# Patient Record
Sex: Female | Born: 1965 | Race: Black or African American | Hispanic: No | Marital: Married | Smoking: Former smoker
Health system: Southern US, Community
[De-identification: ages and names within clinical notes are randomized; demographics above are authoritative.]

## PROBLEM LIST (undated history)

## (undated) DIAGNOSIS — Z9049 Acquired absence of other specified parts of digestive tract: Secondary | ICD-10-CM

## (undated) HISTORY — PX: CHOLECYSTECTOMY: SHX55

---

## 2006-05-02 ENCOUNTER — Emergency Department: Payer: Self-pay | Admitting: Emergency Medicine

## 2008-04-07 ENCOUNTER — Emergency Department: Payer: Self-pay | Admitting: Emergency Medicine

## 2010-05-28 ENCOUNTER — Emergency Department: Payer: Self-pay | Admitting: Emergency Medicine

## 2011-06-06 ENCOUNTER — Emergency Department (HOSPITAL_COMMUNITY): Payer: Self-pay

## 2011-06-06 ENCOUNTER — Emergency Department (HOSPITAL_COMMUNITY)
Admission: EM | Admit: 2011-06-06 | Discharge: 2011-06-07 | Disposition: A | Discharge status: Home / Self Care | Payer: No Typology Code available for payment source | Attending: Emergency Medicine | Admitting: Emergency Medicine

## 2011-06-06 DIAGNOSIS — M79609 Pain in unspecified limb: Secondary | ICD-10-CM | POA: Insufficient documentation

## 2011-06-06 DIAGNOSIS — S62143A Displaced fracture of body of hamate [unciform] bone, unspecified wrist, initial encounter for closed fracture: Secondary | ICD-10-CM | POA: Insufficient documentation

## 2011-06-06 DIAGNOSIS — IMO0002 Reserved for concepts with insufficient information to code with codable children: Secondary | ICD-10-CM | POA: Insufficient documentation

## 2011-06-06 DIAGNOSIS — S82899A Other fracture of unspecified lower leg, initial encounter for closed fracture: Secondary | ICD-10-CM | POA: Insufficient documentation

## 2011-06-06 HISTORY — DX: Acquired absence of other specified parts of digestive tract: Z90.49

## 2011-06-07 ENCOUNTER — Encounter (HOSPITAL_COMMUNITY): Payer: Self-pay | Admitting: Radiology

## 2011-06-07 ENCOUNTER — Emergency Department (HOSPITAL_COMMUNITY): Payer: Self-pay

## 2011-06-07 LAB — POCT I-STAT, CHEM 8
Calcium, Ion: 1.13 mmol/L (ref 1.12–1.32)
Creatinine, Ser: 0.6 mg/dL (ref 0.50–1.10)
Glucose, Bld: 122 mg/dL — ABNORMAL HIGH (ref 70–99)
HCT: 44 % (ref 36.0–46.0)
Hemoglobin: 15 g/dL (ref 12.0–15.0)
Potassium: 3.1 mEq/L — ABNORMAL LOW (ref 3.5–5.1)
TCO2: 21 mmol/L (ref 0–100)

## 2011-06-07 MED ORDER — IOHEXOL 300 MG/ML  SOLN
100.0000 mL | Freq: Once | INTRAMUSCULAR | Status: AC | PRN
  Administered 2011-06-07: 100 mL via INTRAVENOUS

## 2011-12-22 ENCOUNTER — Emergency Department: Payer: Self-pay | Admitting: Emergency Medicine
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
## 2021-07-11 ENCOUNTER — Ambulatory Visit
Admission: EM | Admit: 2021-07-11 | Discharge: 2021-07-11 | Disposition: A | Discharge status: Home / Self Care | Payer: Self-pay | Attending: Emergency Medicine | Admitting: Emergency Medicine

## 2021-07-11 ENCOUNTER — Encounter: Payer: Self-pay | Admitting: Emergency Medicine

## 2021-07-11 ENCOUNTER — Other Ambulatory Visit: Payer: Self-pay

## 2021-07-11 DIAGNOSIS — K047 Periapical abscess without sinus: Secondary | ICD-10-CM

## 2021-07-11 MED ORDER — TRAMADOL HCL 50 MG PO TABS: 50.0000 mg | ORAL_TABLET | Freq: Three times a day (TID) | ORAL | 0 refills | Status: AC | PRN

## 2021-07-11 MED ORDER — AMOXICILLIN-POT CLAVULANATE 875-125 MG PO TABS: 1.0000 | ORAL_TABLET | Freq: Two times a day (BID) | ORAL | 0 refills | Status: DC

## 2021-07-11 NOTE — Discharge Instructions (Signed)
Follow up with dentist to make appointment as soon as possible for evaluation of  left infected/fractured tooth  Use antiseptic mouthwash after each meal to keep area clean Use a soft bristle toothbrush as not to irritate tooth further Floss gently between teeth Rinse your mouth with warm salt water every 2 hours to help relieve pain/swelling. Mix 1 teaspoon of salt to 1 cup of water. Take all medications as prescribed. You may apply ice to affected area of left jaw wrapped in a towel 3-5 times daily for 10-15 minute intervals for discomfort/swelling Apply Orajel as needed to affected area to help with pain.  You may also alternate Tylenol/ibuprofen as needed if these medications are not contraindicated to any current health conditions. Eat soft foods that are not very crunchy (avoid nuts, potato chips, hard candy, etc. until appointment with dentist) Eat foods that are not very hot or very cold as this may irritate the tooth Return to clinic if symptoms worsen  You have been prescribed antibiotics for your illness today Take antibiotic with a meal for each dose Eat Greek yogurt daily while on antibiotic or take a daily probiotic supplement Finish all antibiotic as prescribed Increase fluid intake If you experience worsening of symptoms return to clinic If you experience chest pain, shortness of breath or dizziness go to the ER  Try and contact [facility] for quicker evaluation located at: Address: [address] [place], [place], [place] [address] Phone: [phone]

## 2021-07-11 NOTE — ED Provider Notes (Signed)
CHIEF COMPLAINT:   Chief Complaint  Patient presents with   Oral Swelling     SUBJECTIVE/HPI:  HPI A very pleasant 55 y.o.Female presents today with oral swelling.  Patient reports that she has 2 broken teeth on the left side of her mouth to the upper aspect and swelling to the left lower jaw.  Patient reports that her pain and swelling started yesterday.  Patient reports that she tried to get in with her dentist, but was unable to get in for a few weeks. Patient does not report any shortness of breath, chest pain, palpitations, visual changes, weakness, tingling, headache, nausea, vomiting, diarrhea, fever, chills,   has a past medical history of S/P laparoscopic cholecystectomy. ROS:  Review of Systems See Subjective/HPI Medications, Allergies and Problem List personally reviewed in Epic today OBJECTIVE:   Vitals:   07/11/21 1955  BP: (!) 167/77  Pulse: 92  Temp: 99.2 F (37.3 C)  SpO2: 98%    Physical Exam   General: Appears well-developed and well-nourished. No acute distress.  HEENT Head: Normocephalic and atraumatic.  Ears: Hearing grossly intact, no drainage or visible deformity.  Nose: No nasal deviation or rhinorrhea.  Mouth/Throat: No stridor or tracheal deviation.  Poor dentition noted to left upper jaw with missing teeth and multiple broken teeth with adjacent abscess to outer aspect of left upper gumline. Eyes: Conjunctivae and EOM are normal. No eye drainage or scleral icterus bilaterally.  Neck: Normal range of motion, neck is supple.  Cardiovascular: Normal rate Pulm/Chest: No respiratory distress Neurological: Alert and oriented to person, place, and time.  Skin: Skin is warm and dry.  Psychiatric: Normal mood, affect, behavior, and thought content.   Vital signs and nursing note reviewed.   Patient stable and cooperative with examination.  LABS/X-RAYS/EKG/MEDS:   No results found for any visits on 07/11/21.  MEDICAL DECISION MAKING:   Patient  presents with oral swelling.  Patient reports that she has 2 broken teeth on the left side of her mouth to the upper aspect and swelling to the left lower jaw.  Patient reports that her pain and swelling started yesterday.  Patient reports that she tried to get in with her dentist, but was unable to get in for a few weeks. Patient does not report any shortness of breath, chest pain, palpitations, visual changes, weakness, tingling, headache, nausea, vomiting, diarrhea, fever, chills, given symptoms along with assessment findings, likely dental infection.  Rx'd Augmentin to the patient's preferred pharmacy and tramadol to help with discomfort.  Advised about home treatment and care to include antiseptic mouthwash, ice to the area, Orajel, Tylenol versus ibuprofen and soft foods.  Return with any worsening of symptoms.  Advised to report to the emergency department with any chest pain, shortness of breath or dizziness.  Patient verbalized understanding and agreed with plan.  Patient stable upon discharge.  ASSESSMENT/PLAN:  1. Dental infection - amoxicillin-clavulanate (AUGMENTIN) 875-125 MG tablet; Take 1 tablet by mouth every 12 (twelve) hours.  Dispense: 14 tablet; Refill: 0 - traMADol (ULTRAM) 50 MG tablet; Take 1 tablet (50 mg total) by mouth every 8 (eight) hours as needed for up to 5 days.  Dispense: 15 tablet; Refill: 0  Instructions about new medications and side effects provided.  Plan:   Discharge Instructions      Follow up with dentist to make appointment as soon as possible for evaluation of  left infected/fractured tooth  Use antiseptic mouthwash after each meal to keep area clean Use a soft  bristle toothbrush as not to irritate tooth further Floss gently between teeth Rinse your mouth with warm salt water every 2 hours to help relieve pain/swelling. Mix 1 teaspoon of salt to 1 cup of water. Take all medications as prescribed. You may apply ice to affected area of left jaw wrapped  in a towel 3-5 times daily for 10-15 minute intervals for discomfort/swelling Apply Orajel as needed to affected area to help with pain.  You may also alternate Tylenol/ibuprofen as needed if these medications are not contraindicated to any current health conditions. Eat soft foods that are not very crunchy (avoid nuts, potato chips, hard candy, etc. until appointment with dentist) Eat foods that are not very hot or very cold as this may irritate the tooth Return to clinic if symptoms worsen  You have been prescribed antibiotics for your illness today Take antibiotic with a meal for each dose Eat Greek yogurt daily while on antibiotic or take a daily probiotic supplement Finish all antibiotic as prescribed Increase fluid intake If you experience worsening of symptoms return to clinic If you experience chest pain, shortness of breath or dizziness go to the ER  Try and contact [facility] for quicker evaluation located at: Address: [address] [place], [place], [place] [address] Phone: [phone]          Amalia Greenhouse, [place] 07/11/21 2015

## 2021-07-11 NOTE — ED Triage Notes (Signed)
Pt has 2 broken teeth on the left side of her face. She has some facial swelling and pain that started yesterday. She has a dentist appt. scheduled for a few weeks.

## 2023-03-23 NOTE — Progress Notes (Deleted)
New patient visit  Patient: Dawn Jones   DOB: 06/05/1966   57 y.o. Female  MRN: [id number] Visit Date: 03/27/2023  Today's healthcare provider: Debera Lat, PA-C   No chief complaint on file.  Subjective    Dawn Jones is a 57 y.o. female who presents today as a new patient to establish care.  HPI  *** Discussed the use of AI scribe software for clinical note transcription with the patient, who gave verbal consent to proceed.  History of Present Illness            Past Medical History:  Diagnosis Date   S/P laparoscopic cholecystectomy    No past surgical history on file. No family status information on file.   No family history on file. Social History   Socioeconomic History   Marital status: Married    Spouse name: Not on file   Number of children: Not on file   Years of education: Not on file   Highest education level: Not on file  Occupational History   Not on file  Tobacco Use   Smoking status: Former    Types: Cigarettes   Smokeless tobacco: Never  Vaping Use   Vaping status: Former  Substance and Sexual Activity   Alcohol use: Never   Drug use: Never   Sexual activity: Not on file  Other Topics Concern   Not on file  Social History Narrative   Not on file   Social Determinants of Health   Financial Resource Strain: Not on file  Food Insecurity: Not on file  Transportation Needs: Not on file  Physical Activity: Not on file  Stress: Not on file  Social Connections: Not on file   Outpatient Medications Prior to Visit  Medication Sig   amoxicillin-clavulanate (AUGMENTIN) 875-125 MG tablet Take 1 tablet by mouth every 12 (twelve) hours.   No facility-administered medications prior to visit.   No Known Allergies   There is no immunization history on file for this patient.  Health Maintenance  Topic Date Due   HIV Screening  Never done   Hepatitis C Screening  Never done   DTaP/Tdap/Td (1 - Tdap) Never done   PAP  SMEAR-Modifier  Never done   Colonoscopy  Never done   MAMMOGRAM  Never done   Zoster Vaccines- Shingrix (1 of 2) Never done   COVID-19 Vaccine (1 - 2023-24 season) Never done   INFLUENZA VACCINE  04/10/2023   HPV VACCINES  Aged Out    Patient Care Team: Pcp, No as PCP - General  Review of Systems  All other systems reviewed and are negative.  Except see HPI   {Insert previous labs (optional):23779}  {See past labs  Heme  Chem  Endocrine  Serology  Results Review (optional):1}   Objective    There were no vitals taken for this visit. {Insert last BP/Wt (optional):23777}  {See vitals history (optional):1}  Physical Exam Vitals reviewed.  Constitutional:      General: She is not in acute distress.    Appearance: Normal appearance. She is well-developed. She is not diaphoretic.  HENT:     Head: Normocephalic and atraumatic.  Eyes:     General: No scleral icterus.    Conjunctiva/sclera: Conjunctivae normal.  Neck:     Thyroid: No thyromegaly.  Cardiovascular:     Rate and Rhythm: Normal rate and regular rhythm.     Pulses: Normal pulses.     Heart sounds: Normal heart sounds. No murmur heard.  Pulmonary:     Effort: Pulmonary effort is normal. No respiratory distress.     Breath sounds: Normal breath sounds. No wheezing, rhonchi or rales.  Musculoskeletal:     Cervical back: Neck supple.     Right lower leg: No edema.     Left lower leg: No edema.  Lymphadenopathy:     Cervical: No cervical adenopathy.  Skin:    General: Skin is warm and dry.     Findings: No rash.  Neurological:     Mental Status: She is alert and oriented to person, place, and time. Mental status is at baseline.  Psychiatric:        Mood and Affect: Mood normal.        Behavior: Behavior normal.     Depression Screen     No data to display         No results found for any visits on 03/27/23.  Assessment & Plan     *** Assessment and Plan              Encounter to  establish care Welcomed to our clinic Reviewed past medical hx, social hx, family hx and surgical hx Pt advised to send all vaccination records or screening   No follow-ups on file.     [facility]

## 2023-03-27 ENCOUNTER — Ambulatory Visit: Payer: Medicaid Other | Admitting: Physician Assistant

## 2023-03-27 DIAGNOSIS — Z7689 Persons encountering health services in other specified circumstances: Secondary | ICD-10-CM

## 2023-04-10 DIAGNOSIS — Z419 Encounter for procedure for purposes other than remedying health state, unspecified: Secondary | ICD-10-CM | POA: Diagnosis not present

## 2023-05-11 DIAGNOSIS — Z419 Encounter for procedure for purposes other than remedying health state, unspecified: Secondary | ICD-10-CM | POA: Diagnosis not present

## 2023-05-20 ENCOUNTER — Encounter: Payer: Self-pay | Admitting: Physician Assistant

## 2023-05-20 ENCOUNTER — Ambulatory Visit (INDEPENDENT_AMBULATORY_CARE_PROVIDER_SITE_OTHER): Payer: Medicaid Other | Admitting: Physician Assistant

## 2023-05-20 VITALS — BP 135/93 | HR 75 | Ht 62.0 in | Wt 140.9 lb

## 2023-05-20 DIAGNOSIS — Z23 Encounter for immunization: Secondary | ICD-10-CM

## 2023-05-20 DIAGNOSIS — Z136 Encounter for screening for cardiovascular disorders: Secondary | ICD-10-CM

## 2023-05-20 DIAGNOSIS — R5383 Other fatigue: Secondary | ICD-10-CM | POA: Diagnosis not present

## 2023-05-20 DIAGNOSIS — Z1231 Encounter for screening mammogram for malignant neoplasm of breast: Secondary | ICD-10-CM

## 2023-05-20 DIAGNOSIS — R030 Elevated blood-pressure reading, without diagnosis of hypertension: Secondary | ICD-10-CM | POA: Insufficient documentation

## 2023-05-20 DIAGNOSIS — Z1159 Encounter for screening for other viral diseases: Secondary | ICD-10-CM

## 2023-05-20 DIAGNOSIS — Z114 Encounter for screening for human immunodeficiency virus [HIV]: Secondary | ICD-10-CM

## 2023-05-20 DIAGNOSIS — Z2821 Immunization not carried out because of patient refusal: Secondary | ICD-10-CM

## 2023-05-20 DIAGNOSIS — Z1211 Encounter for screening for malignant neoplasm of colon: Secondary | ICD-10-CM

## 2023-05-20 DIAGNOSIS — Z7689 Persons encountering health services in other specified circumstances: Secondary | ICD-10-CM

## 2023-05-20 NOTE — Progress Notes (Signed)
New patient visit  Patient: Dawn Jones   DOB: [birth year]   57 y.o. Female  MRN: [id number] Visit Date: 05/20/2023  Today's healthcare provider: Debera Lat, PA-C   Chief Complaint  Patient presents with   New Patient (Initial Visit)    Pap Smear - 3 years ago   Subjective    Dawn Jones is a 57 y.o. female who presents today as a new patient to establish care.   Discussed the use of AI scribe software for clinical note transcription with the patient, who gave verbal consent to proceed.  History of Present Illness   The patient, a physically active woman with a history of gallbladder surgery, presents with elevated blood pressure. She attributes this to a pinched nerve in her neck, which has been causing her discomfort for the past two days. She regularly monitors her blood pressure, which is typically within normal limits. She also reports occasional fatigue, but no other symptoms of concern. Her mother has a history of hypertension and high cholesterol, but the patient believes these conditions are related to lifestyle factors rather than heredity. She does not smoke or use recreational drugs, and she drinks alcohol only occasionally. She is taking care of her husband, who is disabled.      Past Medical History:  Diagnosis Date   S/P laparoscopic cholecystectomy    History reviewed. No pertinent surgical history. Family Status  Relation Name Status   Mother  (Not Specified)   Brother  (Not Specified)   MGF  (Not Specified)  No partnership data on file   Family History  Problem Relation Age of Onset   Heart attack Mother 64   Heart attack Brother 54   Heart attack Maternal Grandfather    Social History   Socioeconomic History   Marital status: Married    Spouse name: Not on file   Number of children: Not on file   Years of education: Not on file   Highest education level: Not on file  Occupational History   Not on file  Tobacco Use   Smoking status:  Former    Types: Cigarettes   Smokeless tobacco: Never   Tobacco comments:    Quit 2021  Vaping Use   Vaping status: Some Days  Substance and Sexual Activity   Alcohol use: Never   Drug use: Never   Sexual activity: Not on file  Other Topics Concern   Not on file  Social History Narrative   Not on file   Social Determinants of Health   Financial Resource Strain: Not on file  Food Insecurity: Not on file  Transportation Needs: Not on file  Physical Activity: Not on file  Stress: Not on file  Social Connections: Not on file   Outpatient Medications Prior to Visit  Medication Sig   amoxicillin-clavulanate (AUGMENTIN) 875-125 MG tablet Take 1 tablet by mouth every 12 (twelve) hours. (Patient not taking: Reported on 05/20/2023)   No facility-administered medications prior to visit.   No Known Allergies  Immunization History  Administered Date(s) Administered   Tdap 01/05/2017   Zoster Recombinant(Shingrix) 05/20/2023    Health Maintenance  Topic Date Due   HIV Screening  Never done   Hepatitis C Screening  Never done   PAP SMEAR-Modifier  Never done   Colonoscopy  Never done   MAMMOGRAM  Never done   COVID-19 Vaccine (1 - 2023-24 season) Never done   INFLUENZA VACCINE  12/08/2023 (Originally 04/10/2023)   Zoster Vaccines- Shingrix (2  of 2) 07/15/2023   DTaP/Tdap/Td (2 - Td or Tdap) 01/06/2027   HPV VACCINES  Aged Out    Patient Care Team: Debera Lat, PA-C as PCP - General (Physician Assistant)  Review of Systems  All other systems reviewed and are negative.  Except see HPI       Objective    BP (!) 135/93 (BP Location: Right Arm, Patient Position: Sitting, Cuff Size: Normal)   Pulse 75   Ht 5\' 2"  (1.575 m)   Wt 140 lb 14.4 oz (63.9 kg)   SpO2 100%   BMI 25.77 kg/m     Physical Exam Vitals reviewed.  Constitutional:      General: She is not in acute distress.    Appearance: Normal appearance. She is well-developed. She is not diaphoretic.   HENT:     Head: Normocephalic and atraumatic.  Eyes:     General: No scleral icterus.    Conjunctiva/sclera: Conjunctivae normal.  Neck:     Thyroid: No thyromegaly.  Cardiovascular:     Rate and Rhythm: Normal rate and regular rhythm.     Pulses: Normal pulses.     Heart sounds: Normal heart sounds. No murmur heard. Pulmonary:     Effort: Pulmonary effort is normal. No respiratory distress.     Breath sounds: Normal breath sounds. No wheezing, rhonchi or rales.  Musculoskeletal:     Cervical back: Neck supple.     Right lower leg: No edema.     Left lower leg: No edema.  Lymphadenopathy:     Cervical: No cervical adenopathy.  Skin:    General: Skin is warm and dry.     Findings: No rash.  Neurological:     Mental Status: She is alert and oriented to person, place, and time. Mental status is at baseline.  Psychiatric:        Mood and Affect: Mood normal.        Behavior: Behavior normal.     Depression Screen    05/20/2023    3:33 PM  PHQ 2/9 Scores  PHQ - 2 Score 0   No results found for any visits on 05/20/23.  Assessment & Plan         Elevated Blood Pressure Noted during today's visit, possibly related to pain from a pinched nerve. Patient reports normal readings at home. -Continue home blood pressure monitoring for the next two weeks. -Return in two weeks with blood pressure readings and device for verification.  Need for shingles vaccine - Administer Zoster, Recombinant (Shingrix) Vaccine  Influenza vaccination declined by patient  Breast cancer screening by mammogram - MM 3D Screening Breast Bilateral - [facility]; Future  Encounter for special screening examination for cardiovascular disorder - Cholesterol, total  Colon cancer screening Low risk screening - Ambulatory referral to gastroenterology for colonoscopy  Need for hepatitis C screening test - Hepatitis C antibody  Encounter for screening for HIV Low risk screening - HIV  Antibody (routine testing w rflx)  Other fatigue Chronic Initial workup - CBC with Differential/Platelet - Comprehensive metabolic panel - Hemoglobin A1c - TSH Will reassess after  receiving lab results  General Health Maintenance -Plan for physical exam and Pap smear in two weeks. -Order Hepatitis C and HIV screening. -Order mammogram. -Order basic blood work due to occasional fatigue. -Advise patient to get a colonoscopy for colon cancer screening.  Stress Patient reports high stress levels due to caring for a disabled husband. Will reassess pt at her next  visit for depression and anxiety     Encounter to establish care Welcomed to our clinic Reviewed past medical hx, social hx, family hx and surgical hx Pt advised to send all vaccination records or screening   Return in about 2 weeks (around 06/03/2023) for CPE, BP f/u.   The patient was advised to call back or seek an in-person evaluation if the symptoms worsen or if the condition fails to improve as anticipated.  I discussed the assessment and treatment plan with the patient. The patient was provided an opportunity to ask questions and all were answered. The patient agreed with the plan and demonstrated an understanding of the instructions.  I, Debera Lat, PA-C have reviewed all documentation for this visit. The documentation on  05/20/23  for the exam, diagnosis, procedures, and orders are all accurate and complete.  Debera Lat, [employer] [facility], MMS [facility] [phone] (phone) [phone] (fax)   [facility]

## 2023-05-21 DIAGNOSIS — Z114 Encounter for screening for human immunodeficiency virus [HIV]: Secondary | ICD-10-CM | POA: Diagnosis not present

## 2023-05-21 DIAGNOSIS — Z136 Encounter for screening for cardiovascular disorders: Secondary | ICD-10-CM | POA: Diagnosis not present

## 2023-05-21 DIAGNOSIS — Z1159 Encounter for screening for other viral diseases: Secondary | ICD-10-CM | POA: Diagnosis not present

## 2023-05-22 LAB — CBC WITH DIFFERENTIAL/PLATELET
Basophils Absolute: 0 10*3/uL (ref 0.0–0.2)
Basos: 0 %
EOS (ABSOLUTE): 0.1 10*3/uL (ref 0.0–0.4)
Eos: 1 %
Hematocrit: 34.9 % (ref 34.0–46.6)
Hemoglobin: 11.1 g/dL (ref 11.1–15.9)
Immature Grans (Abs): 0 10*3/uL (ref 0.0–0.1)
Immature Granulocytes: 0 %
Lymphocytes Absolute: 1.5 10*3/uL (ref 0.7–3.1)
Lymphs: 13 %
MCH: 25.3 pg — ABNORMAL LOW (ref 26.6–33.0)
MCHC: 31.8 g/dL (ref 31.5–35.7)
MCV: 80 fL (ref 79–97)
Monocytes Absolute: 0.7 10*3/uL (ref 0.1–0.9)
Monocytes: 7 %
Neutrophils Absolute: 8.8 10*3/uL — ABNORMAL HIGH (ref 1.4–7.0)
Neutrophils: 79 %
Platelets: 269 10*3/uL (ref 150–450)
RBC: 4.38 x10E6/uL (ref 3.77–5.28)
RDW: 14.6 % (ref 11.7–15.4)
WBC: 11.2 10*3/uL — ABNORMAL HIGH (ref 3.4–10.8)

## 2023-05-22 LAB — COMPREHENSIVE METABOLIC PANEL
ALT: 15 IU/L (ref 0–32)
AST: 18 IU/L (ref 0–40)
Albumin: 3.9 g/dL (ref 3.8–4.9)
Alkaline Phosphatase: 97 IU/L (ref 44–121)
BUN/Creatinine Ratio: 22 (ref 9–23)
BUN: 14 mg/dL (ref 6–24)
Bilirubin Total: 0.4 mg/dL (ref 0.0–1.2)
CO2: 24 mmol/L (ref 20–29)
Calcium: 9 mg/dL (ref 8.7–10.2)
Chloride: 102 mmol/L (ref 96–106)
Creatinine, Ser: 0.64 mg/dL (ref 0.57–1.00)
Globulin, Total: 2.9 g/dL (ref 1.5–4.5)
Glucose: 79 mg/dL (ref 70–99)
Potassium: 4.3 mmol/L (ref 3.5–5.2)
Sodium: 139 mmol/L (ref 134–144)
Total Protein: 6.8 g/dL (ref 6.0–8.5)
eGFR: 105 mL/min/{1.73_m2} (ref >59)

## 2023-05-22 LAB — TSH: TSH: 0.89 u[IU]/mL (ref 0.450–4.500)

## 2023-05-22 LAB — HIV ANTIBODY (ROUTINE TESTING W REFLEX): HIV Screen 4th Generation wRfx: NONREACTIVE

## 2023-05-22 LAB — HEPATITIS C ANTIBODY: Hep C Virus Ab: NONREACTIVE

## 2023-05-22 LAB — HEMOGLOBIN A1C
Est. average glucose Bld gHb Est-mCnc: 120 mg/dL
Hgb A1c MFr Bld: 5.8 % — ABNORMAL HIGH (ref 4.8–5.6)

## 2023-05-22 LAB — CHOLESTEROL, TOTAL: Cholesterol, Total: 181 mg/dL (ref 100–199)

## 2023-05-27 ENCOUNTER — Other Ambulatory Visit: Payer: Self-pay

## 2023-05-27 ENCOUNTER — Telehealth: Payer: Self-pay

## 2023-05-27 DIAGNOSIS — Z1211 Encounter for screening for malignant neoplasm of colon: Secondary | ICD-10-CM

## 2023-05-27 MED ORDER — NA SULFATE-K SULFATE-MG SULF 17.5-3.13-1.6 GM/177ML PO SOLN
1.0000 | Freq: Once | ORAL | 0 refills | Status: AC
Start: 2023-05-27 — End: 2023-05-27

## 2023-05-27 NOTE — Telephone Encounter (Signed)
Pt requesting call back to schedule colonoscopy.

## 2023-05-27 NOTE — Telephone Encounter (Signed)
Gastroenterology Pre-Procedure Review  Request Date: 07/14/23 Requesting Physician: Dr. Allegra Lai  PATIENT REVIEW QUESTIONS: The patient responded to the following health history questions as indicated:    1. Are you having any GI issues? no 2. Do you have a personal history of Polyps? no 3. Do you have a family history of Colon Cancer or Polyps? no 4. Diabetes Mellitus? no 5. Joint replacements in the past 12 months?no 6. Major health problems in the past 3 months?no 7. Any artificial heart valves, MVP, or defibrillator?no    MEDICATIONS & ALLERGIES:    Patient reports the following regarding taking any anticoagulation/antiplatelet therapy:   Plavix, Coumadin, Eliquis, Xarelto, Lovenox, Pradaxa, Brilinta, or Effient? no Aspirin? no  Patient confirms/reports the following medications:  Current Outpatient Medications  Medication Sig Dispense Refill   amoxicillin-clavulanate (AUGMENTIN) 875-125 MG tablet Take 1 tablet by mouth every 12 (twelve) hours. (Patient not taking: Reported on 05/20/2023) 14 tablet 0   No current facility-administered medications for this visit.    Patient confirms/reports the following allergies:  No Known Allergies  No orders of the defined types were placed in this encounter.   AUTHORIZATION INFORMATION Primary Insurance: 1D#: Group #:  Secondary Insurance: 1D#: Group #:  SCHEDULE INFORMATION: Date: 07/14/23 Time: Location: [facility]

## 2023-05-31 DIAGNOSIS — M79601 Pain in right arm: Secondary | ICD-10-CM | POA: Diagnosis not present

## 2023-05-31 DIAGNOSIS — F17210 Nicotine dependence, cigarettes, uncomplicated: Secondary | ICD-10-CM | POA: Diagnosis not present

## 2023-05-31 DIAGNOSIS — R202 Paresthesia of skin: Secondary | ICD-10-CM | POA: Diagnosis not present

## 2023-05-31 DIAGNOSIS — M542 Cervicalgia: Secondary | ICD-10-CM | POA: Diagnosis not present

## 2023-06-01 DIAGNOSIS — M542 Cervicalgia: Secondary | ICD-10-CM | POA: Diagnosis not present

## 2023-06-10 DIAGNOSIS — Z419 Encounter for procedure for purposes other than remedying health state, unspecified: Secondary | ICD-10-CM | POA: Diagnosis not present

## 2023-06-15 NOTE — Progress Notes (Unsigned)
Complete physical exam  Patient: Dawn Jones   DOB: [birth year]   57 y.o. Female  MRN: [id number] Visit Date: 06/16/2023  Today's healthcare provider: Debera Lat, PA-C   No chief complaint on file.  Subjective    Dawn Jones is a 57 y.o. female who presents today for a complete physical exam.  She reports consuming a {diet types:17450} diet. {Exercise:19826} She generally feels {well/fairly well/poorly:18703}. She reports sleeping {well/fairly well/poorly:18703}. She {does/does not:200015} have additional problems to discuss today.  HPI  *** Discussed the use of AI scribe software for clinical note transcription with the patient, who gave verbal consent to proceed.  History of Present Illness            Last depression screening scores    05/20/2023    3:33 PM  PHQ 2/9 Scores  PHQ - 2 Score 0   Last fall risk screening    05/20/2023    3:33 PM  Fall Risk   Falls in the past year? 0  Injury with Fall? 0  Risk for fall due to : No Fall Risks  Follow up Falls evaluation completed   Last Audit-C alcohol use screening    06/09/2023    4:20 PM  Alcohol Use Disorder Test (AUDIT)  1. How often do you have a drink containing alcohol? 1  2. How many drinks containing alcohol do you have on a typical day when you are drinking? 0  3. How often do you have six or more drinks on one occasion? 0  AUDIT-C Score 1   A score of 3 or more in women, and 4 or more in men indicates increased risk for alcohol abuse, EXCEPT if all of the points are from question 1   Past Medical History:  Diagnosis Date   S/P laparoscopic cholecystectomy    No past surgical history on file. Social History   Socioeconomic History   Marital status: Married    Spouse name: Not on file   Number of children: Not on file   Years of education: Not on file   Highest education level: Some college, no degree  Occupational History   Not on file  Tobacco Use   Smoking status: Former     Types: Cigarettes   Smokeless tobacco: Never   Tobacco comments:    Quit 2021  Vaping Use   Vaping status: Some Days  Substance and Sexual Activity   Alcohol use: Never   Drug use: Never   Sexual activity: Not on file  Other Topics Concern   Not on file  Social History Narrative   Not on file   Social Determinants of Health   Financial Resource Strain: Low Risk  (06/09/2023)   Overall Financial Resource Strain (CARDIA)    Difficulty of Paying Living Expenses: Not hard at all  Food Insecurity: No Food Insecurity (06/09/2023)   Hunger Vital Sign    Worried About Running Out of Food in the Last Year: Never true    Ran Out of Food in the Last Year: Never true  Transportation Needs: No Transportation Needs (06/09/2023)   PRAPARE - Administrator, Civil Service (Medical): No    Lack of Transportation (Non-Medical): No  Physical Activity: Sufficiently Active (06/09/2023)   Exercise Vital Sign    Days of Exercise per Week: 7 days    Minutes of Exercise per Session: 60 min  Stress: Stress Concern Present (06/09/2023)   [employer] of Occupational Health -  Occupational Stress Questionnaire    Feeling of Stress : To some extent  Social Connections: Unknown (06/09/2023)   Social Connection and Isolation Panel [NHANES]    Frequency of Communication with Friends and Family: More than three times a week    Frequency of Social Gatherings with Friends and Family: Three times a week    Attends Religious Services: Patient declined    Active Member of Clubs or Organizations: No    Attends Engineer, structural: Not on file    Marital Status: Patient declined  Intimate Partner Violence: Not on file   Family Status  Relation Name Status   Mother  (Not Specified)   Brother  (Not Specified)   MGF  (Not Specified)  No partnership data on file   Family History  Problem Relation Age of Onset   Heart attack Mother 80   Heart attack Brother 11   Heart attack Maternal  Grandfather    No Known Allergies  Patient Care Team: Debera Lat, PA-C as PCP - General (Physician Assistant)   Medications: Outpatient Medications Prior to Visit  Medication Sig   amoxicillin-clavulanate (AUGMENTIN) 875-125 MG tablet Take 1 tablet by mouth every 12 (twelve) hours. (Patient not taking: Reported on 05/20/2023)   No facility-administered medications prior to visit.    Review of Systems  All other systems reviewed and are negative.  Except see HPI  {Insert previous labs (optional):23779} {See past labs  Heme  Chem  Endocrine  Serology  Results Review (optional):1}  Objective    There were no vitals taken for this visit. {Insert last BP/Wt (optional):23777}{See vitals history (optional):1}    Physical Exam Vitals reviewed.  Constitutional:      General: She is not in acute distress.    Appearance: Normal appearance. She is well-developed. She is not ill-appearing, toxic-appearing or diaphoretic.  HENT:     Head: Normocephalic and atraumatic.     Right Ear: Tympanic membrane, ear canal and external ear normal.     Left Ear: Tympanic membrane, ear canal and external ear normal.     Nose: Nose normal. No congestion or rhinorrhea.     Mouth/Throat:     Mouth: Mucous membranes are moist.     Pharynx: Oropharynx is clear. No oropharyngeal exudate.  Eyes:     General: No scleral icterus.       Right eye: No discharge.        Left eye: No discharge.     Conjunctiva/sclera: Conjunctivae normal.     Pupils: Pupils are equal, round, and reactive to light.  Neck:     Thyroid: No thyromegaly.     Vascular: No carotid bruit.  Cardiovascular:     Rate and Rhythm: Normal rate and regular rhythm.     Pulses: Normal pulses.     Heart sounds: Normal heart sounds. No murmur heard.    No friction rub. No gallop.  Pulmonary:     Effort: Pulmonary effort is normal. No respiratory distress.     Breath sounds: Normal breath sounds. No wheezing or rales.   Abdominal:     General: Abdomen is flat. Bowel sounds are normal. There is no distension.     Palpations: Abdomen is soft. There is no mass.     Tenderness: There is no abdominal tenderness. There is no right CVA tenderness, left CVA tenderness, guarding or rebound.     Hernia: No hernia is present.  Musculoskeletal:        General: No swelling,  tenderness, deformity or signs of injury. Normal range of motion.     Cervical back: Normal range of motion and neck supple. No rigidity or tenderness.     Right lower leg: No edema.     Left lower leg: No edema.  Lymphadenopathy:     Cervical: No cervical adenopathy.  Skin:    General: Skin is warm and dry.     Coloration: Skin is not jaundiced or pale.     Findings: No bruising, erythema, lesion or rash.  Neurological:     Mental Status: She is alert and oriented to person, place, and time. Mental status is at baseline.     Gait: Gait normal.  Psychiatric:        Mood and Affect: Mood normal.        Behavior: Behavior normal.        Thought Content: Thought content normal.        Judgment: Judgment normal.      No results found for any visits on 06/16/23.  Assessment & Plan    Routine Health Maintenance and Physical Exam  Exercise Activities and Dietary recommendations  Goals   None     Immunization History  Administered Date(s) Administered   Tdap 01/05/2017   Zoster Recombinant(Shingrix) 05/20/2023    Health Maintenance  Topic Date Due   Pap with HPV screening  Never done   Colon Cancer Screening  Never done   Mammogram  Never done   COVID-19 Vaccine (1 - 2023-24 season) Never done   Flu Shot  12/08/2023*   Zoster (Shingles) Vaccine (2 of 2) 07/15/2023   DTaP/Tdap/Td vaccine (2 - Td or Tdap) 01/06/2027   Hepatitis C Screening  Completed   HIV Screening  Completed   HPV Vaccine  Aged Out  *Topic was postponed. The date shown is not the original due date.    Discussed health benefits of physical activity, and  encouraged her to engage in regular exercise appropriate for her age and condition.  Assessment and Plan              ***  No follow-ups on file.     [facility]

## 2023-06-16 ENCOUNTER — Encounter: Payer: Self-pay | Admitting: Physician Assistant

## 2023-06-16 ENCOUNTER — Ambulatory Visit (INDEPENDENT_AMBULATORY_CARE_PROVIDER_SITE_OTHER): Payer: Medicaid Other | Admitting: Physician Assistant

## 2023-06-16 ENCOUNTER — Ambulatory Visit
Admission: RE | Admit: 2023-06-16 | Discharge: 2023-06-16 | Disposition: A | Discharge status: Home / Self Care | Payer: Medicaid Other | Source: Ambulatory Visit | Attending: Physician Assistant | Admitting: Physician Assistant

## 2023-06-16 VITALS — BP 115/78 | HR 85 | Ht 62.0 in | Wt 138.8 lb

## 2023-06-16 DIAGNOSIS — Z0001 Encounter for general adult medical examination with abnormal findings: Secondary | ICD-10-CM | POA: Diagnosis not present

## 2023-06-16 DIAGNOSIS — M542 Cervicalgia: Secondary | ICD-10-CM

## 2023-06-16 DIAGNOSIS — Z1231 Encounter for screening mammogram for malignant neoplasm of breast: Secondary | ICD-10-CM | POA: Diagnosis not present

## 2023-06-16 DIAGNOSIS — Z0000 Encounter for general adult medical examination without abnormal findings: Secondary | ICD-10-CM

## 2023-06-16 DIAGNOSIS — Z01419 Encounter for gynecological examination (general) (routine) without abnormal findings: Secondary | ICD-10-CM

## 2023-07-09 ENCOUNTER — Telehealth: Payer: Self-pay

## 2023-07-09 DIAGNOSIS — Z1211 Encounter for screening for malignant neoplasm of colon: Secondary | ICD-10-CM

## 2023-07-09 NOTE — Telephone Encounter (Signed)
Call returned. Colonoscopy r/s from 11/04 to 12/02.  Raynelle Fanning in Endo notified.  Referral updated.  Marcelino Duster, CMA

## 2023-07-09 NOTE — Telephone Encounter (Signed)
Pt requestin\g call back to cancel colonoscopy for 07/14/2023 and reschedule

## 2023-07-11 DIAGNOSIS — Z419 Encounter for procedure for purposes other than remedying health state, unspecified: Secondary | ICD-10-CM | POA: Diagnosis not present

## 2023-07-11 NOTE — Telephone Encounter (Signed)
okay

## 2023-07-21 ENCOUNTER — Ambulatory Visit: Payer: Medicaid Other | Admitting: Obstetrics & Gynecology

## 2023-07-30 ENCOUNTER — Telehealth: Payer: Self-pay

## 2023-07-30 NOTE — Telephone Encounter (Signed)
Patient called in because she needed her instructions to be mail back out. I did guide her through mychart to find her letters.

## 2023-08-04 ENCOUNTER — Encounter: Payer: Self-pay | Admitting: Gastroenterology

## 2023-08-04 ENCOUNTER — Encounter: Payer: Self-pay | Admitting: Obstetrics & Gynecology

## 2023-08-04 ENCOUNTER — Other Ambulatory Visit (HOSPITAL_COMMUNITY)
Admission: RE | Admit: 2023-08-04 | Discharge: 2023-08-04 | Disposition: A | Discharge status: Home / Self Care | Payer: Medicaid Other | Source: Ambulatory Visit | Attending: Obstetrics & Gynecology | Admitting: Obstetrics & Gynecology

## 2023-08-04 ENCOUNTER — Ambulatory Visit (INDEPENDENT_AMBULATORY_CARE_PROVIDER_SITE_OTHER): Payer: Medicaid Other | Admitting: Obstetrics & Gynecology

## 2023-08-04 VITALS — BP 135/83 | HR 83 | Ht 62.0 in | Wt 136.4 lb

## 2023-08-04 DIAGNOSIS — Z0000 Encounter for general adult medical examination without abnormal findings: Secondary | ICD-10-CM | POA: Insufficient documentation

## 2023-08-04 DIAGNOSIS — Z01419 Encounter for gynecological examination (general) (routine) without abnormal findings: Secondary | ICD-10-CM

## 2023-08-04 DIAGNOSIS — Z1151 Encounter for screening for human papillomavirus (HPV): Secondary | ICD-10-CM | POA: Diagnosis not present

## 2023-08-04 DIAGNOSIS — R399 Unspecified symptoms and signs involving the genitourinary system: Secondary | ICD-10-CM

## 2023-08-04 DIAGNOSIS — Z124 Encounter for screening for malignant neoplasm of cervix: Secondary | ICD-10-CM | POA: Insufficient documentation

## 2023-08-04 NOTE — Progress Notes (Signed)
GYNECOLOGY ANNUAL PHYSICAL EXAM PROGRESS NOTE  Subjective:    IANNA Jones is a 57 y.o. married P2 who presents for an annual exam as a new patient.  The patient is rarely sexually active due to decreased libido, denies dyspareunia.  The patient participates in regular exercise: yes. Has the patient ever been transfused or tattooed?: yes. The patient reports that there is not domestic violence in her life.   The patient has the following complaints today: She is having some dysuria, using Azo.  Menstrual History:  No LMP recorded. Patient is postmenopausal. LMP around age 70  She denies FH of breast, gyn, colon, pancreas, brain cancer  She is scheduled for colonoscopy next week.     Gynecologic History:    Last Pap: about 5 years ago. Results were: normal.  abnormal pap smears. Last mammogram: 2024. Results were: normal       OB History  No obstetric history on file.    Past Medical History:  Diagnosis Date   S/P laparoscopic cholecystectomy     Past Surgical History:  Procedure Laterality Date   CHOLECYSTECTOMY      Family History  Problem Relation Age of Onset   Heart attack Mother 56   Heart attack Brother 70   Heart attack Maternal Grandfather     Social History   Socioeconomic History   Marital status: Married    Spouse name: Not on file   Number of children: Not on file   Years of education: Not on file   Highest education level: Some college, no degree  Occupational History   Not on file  Tobacco Use   Smoking status: Former    Types: Cigarettes   Smokeless tobacco: Never   Tobacco comments:    Quit 2021  Vaping Use   Vaping status: Some Days  Substance and Sexual Activity   Alcohol use: Never   Drug use: Never   Sexual activity: Yes    Birth control/protection: Post-menopausal  Other Topics Concern   Not on file  Social History Narrative   Not on file   Social Determinants of Health   Financial Resource Strain: Low  Risk  (06/09/2023)   Overall Financial Resource Strain (CARDIA)    Difficulty of Paying Living Expenses: Not hard at all  Food Insecurity: No Food Insecurity (06/09/2023)   Hunger Vital Sign    Worried About Running Out of Food in the Last Year: Never true    Ran Out of Food in the Last Year: Never true  Transportation Needs: No Transportation Needs (06/09/2023)   PRAPARE - Administrator, Civil Service (Medical): No    Lack of Transportation (Non-Medical): No  Physical Activity: Sufficiently Active (06/09/2023)   Exercise Vital Sign    Days of Exercise per Week: 7 days    Minutes of Exercise per Session: 60 min  Stress: Stress Concern Present (06/09/2023)   [employer] of Occupational Health - Occupational Stress Questionnaire    Feeling of Stress : To some extent  Social Connections: Unknown (06/09/2023)   Social Connection and Isolation Panel [NHANES]    Frequency of Communication with Friends and Family: More than three times a week    Frequency of Social Gatherings with Friends and Family: Three times a week    Attends Religious Services: Patient declined    Active Member of Clubs or Organizations: No    Attends Banker Meetings: Not on file    Marital Status: Patient  declined  Intimate Partner Violence: Not on file    No current outpatient medications on file prior to visit.   No current facility-administered medications on file prior to visit.    No Known Allergies   Review of Systems Constitutional: negative for chills, fatigue, fevers and sweats Eyes: negative for irritation, redness and visual disturbance Ears, nose, mouth, throat, and face: negative for hearing loss, nasal congestion, snoring and tinnitus Respiratory: negative for asthma, cough, sputum Cardiovascular: negative for chest pain, dyspnea, exertional chest pressure/discomfort, irregular heart beat, palpitations and syncope Gastrointestinal: negative for abdominal pain, change  in bowel habits, nausea and vomiting Genitourinary: negative for abnormal menstrual periods, genital lesions, sexual problems and vaginal discharge, dysuria and urinary incontinence Integument/breast: negative for breast lump, breast tenderness and nipple discharge Hematologic/lymphatic: negative for bleeding and easy bruising Musculoskeletal:negative for back pain and muscle weakness Neurological: negative for dizziness, headaches, vertigo and weakness Endocrine: negative for diabetic symptoms including polydipsia, polyuria and skin dryness Allergic/Immunologic: negative for hay fever and urticaria      Objective:  Blood pressure 135/83, pulse 83, height 5\' 2"  (1.575 m), weight 136 lb 6.4 oz (61.9 kg). Body mass index is 24.95 kg/m.    General Appearance:    Alert, cooperative, no distress, appears stated age  Head:    Normocephalic, without obvious abnormality, atraumatic  Eyes:    PERRL, conjunctiva/corneas clear, EOM's intact, both eyes  Ears:    Normal external ear canals, both ears  Nose:   Nares normal, septum midline, mucosa normal, no drainage or sinus tenderness  Throat:   Lips, mucosa, and tongue normal; teeth and gums normal  Neck:   Supple, symmetrical, trachea midline, no adenopathy; thyroid: no enlargement/tenderness/nodules; no carotid bruit or JVD  Back:     Symmetric, no curvature, ROM normal, no CVA tenderness  Lungs:     Clear to auscultation bilaterally, respirations unlabored  Chest Wall:    No tenderness or deformity   Heart:    Regular rate and rhythm, S1 and S2 normal, no murmur, rub or gallop  Breast Exam:    No tenderness, masses, or nipple abnormality  Abdomen:     Soft, non-tender, bowel sounds active all four quadrants, no masses, no organomegaly.    Genitalia:    Pelvic:external genitalia normal with mild VVA vagina without lesions, discharge, or tenderness, rectovaginal septum  normal. Cervix normal in appearance, grade 1 prolapse, no cervical motion  tenderness, no adnexal masses or tenderness.  Uterus normal size, shape, anteverted mobile, regular contours, nontender.  Rectal:    Normal external sphincter.  No hemorrhoids appreciated. Internal exam not done.   Extremities:   Extremities normal, atraumatic, no cyanosis or edema  Pulses:   2+ and symmetric all extremities  Skin:   Skin color, texture, turgor normal, no rashes or lesions  Lymph nodes:   Cervical, supraclavicular, and axillary nodes normal  Neurologic:   CNII-XII intact, normal strength, sensation and reflexes throughout   .  Labs:  Lab Results  Component Value Date   WBC 11.2 (H) 05/21/2023   HGB 11.1 05/21/2023   HCT 34.9 05/21/2023   MCV 80 05/21/2023   PLT 269 05/21/2023    Lab Results  Component Value Date   CREATININE 0.64 05/21/2023   BUN 14 05/21/2023   NA 139 05/21/2023   K 4.3 05/21/2023   CL 102 05/21/2023   CO2 24 05/21/2023    Lab Results  Component Value Date   ALT 15 05/21/2023   AST  18 05/21/2023   ALKPHOS 97 05/21/2023   BILITOT 0.4 05/21/2023    Lab Results  Component Value Date   TSH 0.890 05/21/2023     Assessment:   Well woman exam   Plan:   Breast self exam technique reviewed and patient encouraged to perform self-exam monthly. Discussed healthy lifestyle modifications. Pap smear ordered. Flu vaccine: declined Follow up in 1 year for annual exam   Allie Bossier, MD [facility]

## 2023-08-06 LAB — CYTOLOGY - PAP
Comment: NEGATIVE
Diagnosis: NEGATIVE
High risk HPV: NEGATIVE

## 2023-08-06 LAB — URINE CULTURE: Organism ID, Bacteria: NO GROWTH

## 2023-08-10 DIAGNOSIS — Z419 Encounter for procedure for purposes other than remedying health state, unspecified: Secondary | ICD-10-CM | POA: Diagnosis not present

## 2023-08-10 DIAGNOSIS — D509 Iron deficiency anemia, unspecified: Secondary | ICD-10-CM

## 2023-08-10 DIAGNOSIS — K922 Gastrointestinal hemorrhage, unspecified: Secondary | ICD-10-CM

## 2023-08-10 HISTORY — DX: Iron deficiency anemia, unspecified: D50.9

## 2023-08-10 HISTORY — DX: Gastrointestinal hemorrhage, unspecified: K92.2

## 2023-08-11 ENCOUNTER — Telehealth: Payer: Self-pay

## 2023-08-11 ENCOUNTER — Encounter: Payer: Self-pay | Admitting: Gastroenterology

## 2023-08-11 ENCOUNTER — Ambulatory Visit: Payer: Medicaid Other | Admitting: Certified Registered Nurse Anesthetist

## 2023-08-11 ENCOUNTER — Encounter
Admission: RE | Disposition: A | Discharge status: Home / Self Care | Payer: Self-pay | Source: Home / Self Care | Attending: Gastroenterology

## 2023-08-11 ENCOUNTER — Other Ambulatory Visit
Admission: RE | Admit: 2023-08-11 | Discharge: 2023-08-11 | Disposition: A | Discharge status: Home / Self Care | Payer: Medicaid Other | Source: Skilled Nursing Facility | Attending: Gastroenterology | Admitting: Gastroenterology

## 2023-08-11 ENCOUNTER — Ambulatory Visit
Admission: RE | Admit: 2023-08-11 | Discharge: 2023-08-11 | Disposition: A | Discharge status: Home / Self Care | Payer: Medicaid Other | Attending: Gastroenterology | Admitting: Gastroenterology

## 2023-08-11 ENCOUNTER — Other Ambulatory Visit: Payer: Self-pay

## 2023-08-11 DIAGNOSIS — D649 Anemia, unspecified: Secondary | ICD-10-CM

## 2023-08-11 DIAGNOSIS — K5520 Angiodysplasia of colon without hemorrhage: Secondary | ICD-10-CM

## 2023-08-11 DIAGNOSIS — Z87891 Personal history of nicotine dependence: Secondary | ICD-10-CM | POA: Insufficient documentation

## 2023-08-11 DIAGNOSIS — Z9049 Acquired absence of other specified parts of digestive tract: Secondary | ICD-10-CM | POA: Insufficient documentation

## 2023-08-11 DIAGNOSIS — D500 Iron deficiency anemia secondary to blood loss (chronic): Secondary | ICD-10-CM

## 2023-08-11 DIAGNOSIS — Z1211 Encounter for screening for malignant neoplasm of colon: Secondary | ICD-10-CM | POA: Insufficient documentation

## 2023-08-11 HISTORY — PX: COLONOSCOPY WITH PROPOFOL: SHX5780

## 2023-08-11 HISTORY — PX: HEMOSTASIS CONTROL: SHX6838

## 2023-08-11 HISTORY — DX: Angiodysplasia of colon without hemorrhage: K55.20

## 2023-08-11 LAB — CBC
HCT: 32.7 % — ABNORMAL LOW (ref 36.0–46.0)
Hemoglobin: 10.1 g/dL — ABNORMAL LOW (ref 12.0–15.0)
MCH: 24.2 pg — ABNORMAL LOW (ref 26.0–34.0)
MCHC: 30.9 g/dL (ref 30.0–36.0)
MCV: 78.4 fL — ABNORMAL LOW (ref 80.0–100.0)
Platelets: 310 10*3/uL (ref 150–400)
RBC: 4.17 MIL/uL (ref 3.87–5.11)
RDW: 14.2 % (ref 11.5–15.5)
WBC: 6.7 10*3/uL (ref 4.0–10.5)
nRBC: 0 % (ref 0.0–0.2)

## 2023-08-11 LAB — IRON AND TIBC
Iron: 37 ug/dL (ref 28–170)
Saturation Ratios: 7 % — ABNORMAL LOW (ref 10.4–31.8)
TIBC: 528 ug/dL — ABNORMAL HIGH (ref 250–450)
UIBC: 491 ug/dL

## 2023-08-11 LAB — FOLATE: Folate: 20.2 ng/mL (ref >5.9)

## 2023-08-11 LAB — FERRITIN: Ferritin: 4 ng/mL — ABNORMAL LOW (ref 11–307)

## 2023-08-11 LAB — VITAMIN B12: Vitamin B-12: 766 pg/mL (ref 180–914)

## 2023-08-11 SURGERY — COLONOSCOPY WITH PROPOFOL
Anesthesia: General

## 2023-08-11 MED ORDER — FENTANYL CITRATE (PF) 100 MCG/2ML IJ SOLN
INTRAMUSCULAR | Status: DC | PRN
  Administered 2023-08-11: 50 ug via INTRAVENOUS

## 2023-08-11 MED ORDER — LIDOCAINE HCL (CARDIAC) PF 100 MG/5ML IV SOSY
PREFILLED_SYRINGE | INTRAVENOUS | Status: DC | PRN
  Administered 2023-08-11: 50 mg via INTRAVENOUS

## 2023-08-11 MED ORDER — PROPOFOL 10 MG/ML IV BOLUS
INTRAVENOUS | Status: DC | PRN
  Administered 2023-08-11: 40 mg via INTRAVENOUS
  Administered 2023-08-11: 80 mg via INTRAVENOUS
  Administered 2023-08-11: 150 ug/kg/min via INTRAVENOUS

## 2023-08-11 MED ORDER — SODIUM CHLORIDE 0.9 % IV SOLN: INTRAVENOUS | Status: DC

## 2023-08-11 MED ORDER — DEXMEDETOMIDINE HCL IN NACL 80 MCG/20ML IV SOLN
INTRAVENOUS | Status: DC | PRN
Start: 2023-08-11 — End: 2023-08-11
  Administered 2023-08-11: 8 ug via INTRAVENOUS

## 2023-08-11 MED ORDER — PROPOFOL 1000 MG/100ML IV EMUL
INTRAVENOUS | Status: AC
  Filled 2023-08-11: qty 100

## 2023-08-11 MED ORDER — FUSION PLUS PO CAPS: 1.0000 | ORAL_CAPSULE | ORAL | 2 refills | Status: DC

## 2023-08-11 MED ORDER — FENTANYL CITRATE (PF) 100 MCG/2ML IJ SOLN
INTRAMUSCULAR | Status: AC
  Filled 2023-08-11: qty 2

## 2023-08-11 NOTE — Op Note (Addendum)
[facility] Gastroenterology Patient Name: Madlene Hulslander Procedure Date: 08/11/2023 8:24 AM MRN: [id number] Account #: [id number] Date of Birth: 02-15-1966 Admit Type: Outpatient Age: 57 Room: [place] [facility] ROOM 4 Gender: Female Note Status: Finalized Instrument Name: Nelda Marseille [id number] Procedure:             Colonoscopy Indications:           Screening for colorectal malignant neoplasm, This is                         the patient's first colonoscopy Providers:             Toney Reil MD, MD Referring MD:          Debera Lat (Referring MD) Medicines:             General Anesthesia Complications:         No immediate complications. Estimated blood loss:                         Minimal. Procedure:             Pre-Anesthesia Assessment:                        - Prior to the procedure, a History and Physical was                         performed, and patient medications and allergies were                         reviewed. The patient is competent. The risks and                         benefits of the procedure and the sedation options and                         risks were discussed with the patient. All questions                         were answered and informed consent was obtained.                         Patient identification and proposed procedure were                         verified by the physician, the nurse, the                         anesthesiologist, the anesthetist and the technician                         in the pre-procedure area in the procedure room in the                         endoscopy suite. Mental Status Examination: alert and                         oriented. Airway Examination: normal oropharyngeal  airway and neck mobility. Respiratory Examination:                         clear to auscultation. CV Examination: normal.                         Prophylactic Antibiotics: The patient does not require                          prophylactic antibiotics. Prior Anticoagulants: The                         patient has taken no anticoagulant or antiplatelet                         agents. ASA Grade Assessment: I - A normal, healthy                         patient. After reviewing the risks and benefits, the                         patient was deemed in satisfactory condition to                         undergo the procedure. The anesthesia plan was to use                         general anesthesia. Immediately prior to                         administration of medications, the patient was                         re-assessed for adequacy to receive sedatives. The                         heart rate, respiratory rate, oxygen saturations,                         blood pressure, adequacy of pulmonary ventilation, and                         response to care were monitored throughout the                         procedure. The physical status of the patient was                         re-assessed after the procedure.                        After obtaining informed consent, the colonoscope was                         passed under direct vision. Throughout the procedure,                         the patient's blood pressure, pulse, and oxygen  saturations were monitored continuously. The                         Colonoscope was introduced through the anus and                         advanced to the the terminal ileum, with                         identification of the appendiceal orifice and IC                         valve. The colonoscopy was performed without                         difficulty. The patient tolerated the procedure well.                         The quality of the bowel preparation was evaluated                         using the BBPS [facility] Bowel Preparation Scale) with                         scores of: Right Colon = 3, Transverse Colon = 3 and                         Left  Colon = 3 (entire mucosa seen well with no                         residual staining, small fragments of stool or opaque                         liquid). The total BBPS score equals 9. The terminal                         ileum, ileocecal valve, appendiceal orifice, and                         rectum were photographed. Findings:      Provation stopped capturing images during middle of procedure.      The perianal and digital rectal examinations were normal. Pertinent       negatives include normal sphincter tone and no palpable rectal lesions.      The terminal ileum appeared normal.      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 With her       symptoms of fatigue and drop in hemoglobin to 11 from 15, decision was       made to treat these lesions. Coagulation for hemostasis using argon       plasma was successful. Estimated blood loss was minimal.      The exam was otherwise without abnormality. Impression:            - The examined portion of the ileum was normal.                        -  Multiple non-bleeding colonic angioectasias. Treated                         with argon plasma coagulation (APC).                        - The examination was otherwise normal.                        - No specimens collected. Recommendation:        - Discharge patient to home (with escort).                        - Full liquid diet today.                        - Continue present medications.                        - Return to my office at appointment to be scheduled.                        - - Check hemogram with white blood cell count and                         platelets, iron panel, B12 and folate levels Procedure Code(s):     --- Professional ---                        [phone], Colonoscopy, flexible; with control of                         bleeding, any method Diagnosis Code(s):     --- Professional ---                         Z12.11, Encounter for screening for malignant neoplasm                         of colon                        K55.20, Angiodysplasia of colon without hemorrhage CPT copyright 2022 American Medical Association. All rights reserved. The codes documented in this report are preliminary and upon coder review may  be revised to meet current compliance requirements. Dr. Libby Maw Toney Reil MD, MD 08/11/2023 9:17:00 AM This report has been signed electronically. Number of Addenda: 0 Note Initiated On: 08/11/2023 8:24 AM Scope Withdrawal Time: 0 hours 22 minutes 32 seconds  Total Procedure Duration: 0 hours 27 minutes 47 seconds  Estimated Blood Loss:  Estimated blood loss was minimal.      [facility]

## 2023-08-11 NOTE — Anesthesia Procedure Notes (Signed)
Date/Time: 08/11/2023 8:30 AM  Performed by: Ginger Carne, CRNAPre-anesthesia Checklist: Patient identified, Emergency Drugs available, Suction available, Patient being monitored and Timeout performed Patient Re-evaluated:Patient Re-evaluated prior to induction Oxygen Delivery Method: Nasal cannula Preoxygenation: Pre-oxygenation with 100% oxygen Induction Type: IV induction

## 2023-08-11 NOTE — Transfer of Care (Signed)
Immediate Anesthesia Transfer of Care Note  Patient: Dawn Jones  Procedure(s) Performed: COLONOSCOPY WITH PROPOFOL HEMOSTASIS CONTROL  Patient Location: Endoscopy Unit  Anesthesia Type:General  Level of Consciousness: awake, alert , and oriented  Airway & Oxygen Therapy: Patient Spontanous Breathing  Post-op Assessment: Report given to RN and Post -op Vital signs reviewed and stable  Post vital signs: Reviewed and stable  Last Vitals:  Vitals Value Taken Time  BP 93/65 08/11/23 0908  Temp 36.4 C 08/11/23 0908  Pulse 72 08/11/23 0909  Resp 20 08/11/23 0909  SpO2 99 % 08/11/23 0909  Vitals shown include unfiled device data.  Last Pain:  Vitals:   08/11/23 0908  TempSrc: Temporal  PainSc: 0-No pain         Complications: No notable events documented.

## 2023-08-11 NOTE — H&P (Signed)
Arlyss Repress, MD [address]  [address]  [place], [place] [address]  Main: [phone]  Fax: [phone] Pager: [phone]  Primary Care Physician:  Debera Lat, PA-C Primary Gastroenterologist:  Dr. Arlyss Repress  Pre-Procedure History & Physical: HPI:  Dawn Jones is a 57 y.o. female is here for an colonoscopy.   Past Medical History:  Diagnosis Date   S/P laparoscopic cholecystectomy     Past Surgical History:  Procedure Laterality Date   CHOLECYSTECTOMY      Prior to Admission medications   Not on File    Allergies as of 05/28/2023   (No Known Allergies)    Family History  Problem Relation Age of Onset   Heart attack Mother 66   Heart attack Brother 59   Heart attack Maternal Grandfather     Social History   Socioeconomic History   Marital status: Married    Spouse name: Not on file   Number of children: Not on file   Years of education: Not on file   Highest education level: Some college, no degree  Occupational History   Not on file  Tobacco Use   Smoking status: Former    Types: Cigarettes   Smokeless tobacco: Never   Tobacco comments:    Quit 2021  Vaping Use   Vaping status: Some Days  Substance and Sexual Activity   Alcohol use: Never   Drug use: Never   Sexual activity: Yes    Birth control/protection: Post-menopausal  Other Topics Concern   Not on file  Social History Narrative   Not on file   Social Determinants of Health   Financial Resource Strain: Low Risk  (06/09/2023)   Overall Financial Resource Strain (CARDIA)    Difficulty of Paying Living Expenses: Not hard at all  Food Insecurity: No Food Insecurity (06/09/2023)   Hunger Vital Sign    Worried About Running Out of Food in the Last Year: Never true    Ran Out of Food in the Last Year: Never true  Transportation Needs: No Transportation Needs (06/09/2023)   PRAPARE - Administrator, Civil Service (Medical): No    Lack of Transportation  (Non-Medical): No  Physical Activity: Sufficiently Active (06/09/2023)   Exercise Vital Sign    Days of Exercise per Week: 7 days    Minutes of Exercise per Session: 60 min  Stress: Stress Concern Present (06/09/2023)   [employer] of Occupational Health - Occupational Stress Questionnaire    Feeling of Stress : To some extent  Social Connections: Unknown (06/09/2023)   Social Connection and Isolation Panel [NHANES]    Frequency of Communication with Friends and Family: More than three times a week    Frequency of Social Gatherings with Friends and Family: Three times a week    Attends Religious Services: Patient declined    Active Member of Clubs or Organizations: No    Attends Engineer, structural: Not on file    Marital Status: Patient declined  Intimate Partner Violence: Not on file    Review of Systems: See HPI, otherwise negative ROS  Physical Exam: BP 124/87   Pulse 65   Temp (!) 96.7 F (35.9 C) (Temporal)   Wt 62.1 kg   SpO2 100%   BMI 25.06 kg/m  General:   Alert,  pleasant and cooperative in NAD Head:  Normocephalic and atraumatic. Neck:  Supple; no masses or thyromegaly. Lungs:  Clear throughout to auscultation.    Heart:  Regular rate and rhythm. Abdomen:  Soft, nontender and nondistended. Normal bowel sounds, without guarding, and without rebound.   Neurologic:  Alert and  oriented x4;  grossly normal neurologically.  Impression/Plan: Dawn Jones is here for an colonoscopy to be performed for colon cancer screening  Risks, benefits, limitations, and alternatives regarding  colonoscopy have been reviewed with the patient.  Questions have been answered.  All parties agreeable.   Lannette Donath, MD  08/11/2023, 8:25 AM

## 2023-08-11 NOTE — Anesthesia Preprocedure Evaluation (Signed)
Anesthesia Evaluation  Patient identified by MRN, date of birth, ID band Patient awake    Reviewed: Allergy & Precautions, NPO status , Patient's Chart, lab work & pertinent test results  History of Anesthesia Complications Negative for: history of anesthetic complications  Airway Mallampati: III  TM Distance: >3 FB Neck ROM: full    Dental  (+) Chipped, Partial Upper, Partial Lower, Poor Dentition, Missing   Pulmonary neg shortness of breath, former smoker   Pulmonary exam normal        Cardiovascular Exercise Tolerance: Good (-) angina negative cardio ROS Normal cardiovascular exam     Neuro/Psych negative neurological ROS  negative psych ROS   GI/Hepatic negative GI ROS, Neg liver ROS,neg GERD  ,,  Endo/Other  negative endocrine ROS    Renal/GU negative Renal ROS  negative genitourinary   Musculoskeletal   Abdominal   Peds  Hematology negative hematology ROS (+)   Anesthesia Other Findings Past Medical History: No date: S/P laparoscopic cholecystectomy  Past Surgical History: No date: CHOLECYSTECTOMY  BMI    Body Mass Index: 25.06 kg/m      Reproductive/Obstetrics negative OB ROS                             Anesthesia Physical Anesthesia Plan  ASA: 1  Anesthesia Plan: General   Post-op Pain Management:    Induction: Intravenous  PONV Risk Score and Plan: Propofol infusion and TIVA  Airway Management Planned: Natural Airway and Nasal Cannula  Additional Equipment:   Intra-op Plan:   Post-operative Plan:   Informed Consent: I have reviewed the patients History and Physical, chart, labs and discussed the procedure including the risks, benefits and alternatives for the proposed anesthesia with the patient or authorized representative who has indicated his/her understanding and acceptance.     Dental Advisory Given  Plan Discussed with: Anesthesiologist, CRNA  and Surgeon  Anesthesia Plan Comments: (Patient consented for risks of anesthesia including but not limited to:  - adverse reactions to medications - risk of airway placement if required - damage to eyes, teeth, lips or other oral mucosa - nerve damage due to positioning  - sore throat or hoarseness - Damage to heart, brain, nerves, lungs, other parts of body or loss of life  Patient voiced understanding and assent.)       Anesthesia Quick Evaluation

## 2023-08-11 NOTE — Telephone Encounter (Signed)
The patient called and left a voicemail requesting to reschedule her EGD on Monday 08/18/23.  I called the patient back to inform him we received his message, and left a voicemail to call back.

## 2023-08-11 NOTE — Telephone Encounter (Signed)
PT returned call requesting call back

## 2023-08-11 NOTE — Telephone Encounter (Signed)
Patient verbalized understanding. Sent Fusion plus to the pharmacy. Schedule EGD for 09/15/2022 and follow up appointment. Went over instructions, mailed them and sent to [employer]

## 2023-08-11 NOTE — Anesthesia Postprocedure Evaluation (Signed)
Anesthesia Post Note  Patient: Dawn Jones  Procedure(s) Performed: COLONOSCOPY WITH PROPOFOL HEMOSTASIS CONTROL  Patient location during evaluation: Endoscopy Anesthesia Type: General Level of consciousness: awake and alert Pain management: pain level controlled Vital Signs Assessment: post-procedure vital signs reviewed and stable Respiratory status: spontaneous breathing, nonlabored ventilation, respiratory function stable and patient connected to nasal cannula oxygen Cardiovascular status: blood pressure returned to baseline and stable Postop Assessment: no apparent nausea or vomiting Anesthetic complications: no   No notable events documented.   Last Vitals:  Vitals:   08/11/23 0805 08/11/23 0908  BP: 124/87 93/65  Pulse: 65 71  Resp:  20  Temp: (!) 35.9 C 36.4 C  SpO2: 100% 100%    Last Pain:  Vitals:   08/11/23 0918  TempSrc:   PainSc: 0-No pain                 Cleda Mccreedy Teresina Bugaj

## 2023-08-11 NOTE — Telephone Encounter (Signed)
Called and left a message for call back  

## 2023-08-11 NOTE — Telephone Encounter (Signed)
-----   Message from [facility] sent at 08/11/2023 11:06 AM EST ----- Please inform pt that she does have severe iron deficiency anemia ad probably she was bleeding from AVMs detected in colon today  Recommend to start fusion plus one pill every other day, upper endoscopy.  If this is negative, recommend video capsule endoscopy for workup of iron deficiency anemia and rule out any AVMs in the stomach and her small intestine if patient is agreeable  She will also need to see me or Inetta Fermo in next 1 to 2 months for iron deficiency anemia  Rohini Vanga

## 2023-08-11 NOTE — Addendum Note (Signed)
Addended by: Radene Knee L on: 08/11/2023 04:49 PM   Modules accepted: Orders

## 2023-08-12 ENCOUNTER — Encounter: Payer: Self-pay | Admitting: Gastroenterology

## 2023-08-12 NOTE — Telephone Encounter (Signed)
Called Dawn Jones and she will move patient to the [facility] depo and [facility] and talk to [place] and she will get patient moved. Called patient and left a  detail message for patient.

## 2023-08-15 ENCOUNTER — Encounter: Payer: Self-pay | Admitting: Gastroenterology

## 2023-08-18 ENCOUNTER — Ambulatory Visit: Payer: Self-pay

## 2023-08-18 ENCOUNTER — Encounter
Admission: RE | Disposition: A | Discharge status: Home / Self Care | Payer: Self-pay | Source: Home / Self Care | Attending: Gastroenterology

## 2023-08-18 ENCOUNTER — Ambulatory Visit
Admission: RE | Admit: 2023-08-18 | Discharge: 2023-08-18 | Disposition: A | Discharge status: Home / Self Care | Payer: Medicaid Other | Attending: Gastroenterology | Admitting: Gastroenterology

## 2023-08-18 ENCOUNTER — Telehealth: Payer: Self-pay

## 2023-08-18 ENCOUNTER — Other Ambulatory Visit: Payer: Self-pay

## 2023-08-18 ENCOUNTER — Encounter: Payer: Self-pay | Admitting: Gastroenterology

## 2023-08-18 DIAGNOSIS — K5710 Diverticulosis of small intestine without perforation or abscess without bleeding: Secondary | ICD-10-CM | POA: Diagnosis not present

## 2023-08-18 DIAGNOSIS — D500 Iron deficiency anemia secondary to blood loss (chronic): Secondary | ICD-10-CM | POA: Diagnosis not present

## 2023-08-18 DIAGNOSIS — Z87891 Personal history of nicotine dependence: Secondary | ICD-10-CM | POA: Diagnosis not present

## 2023-08-18 DIAGNOSIS — K2970 Gastritis, unspecified, without bleeding: Secondary | ICD-10-CM | POA: Diagnosis not present

## 2023-08-18 DIAGNOSIS — D509 Iron deficiency anemia, unspecified: Secondary | ICD-10-CM | POA: Diagnosis not present

## 2023-08-18 HISTORY — PX: ESOPHAGOGASTRODUODENOSCOPY (EGD) WITH PROPOFOL: SHX5813

## 2023-08-18 HISTORY — PX: BIOPSY: SHX5522

## 2023-08-18 SURGERY — ESOPHAGOGASTRODUODENOSCOPY (EGD) WITH PROPOFOL
Anesthesia: General

## 2023-08-18 MED ORDER — SODIUM CHLORIDE 0.9 % IV SOLN: INTRAVENOUS | Status: DC

## 2023-08-18 MED ORDER — LIDOCAINE HCL (PF) 2 % IJ SOLN
INTRAMUSCULAR | Status: DC | PRN
  Administered 2023-08-18: 100 mg via INTRADERMAL

## 2023-08-18 MED ORDER — PROPOFOL 500 MG/50ML IV EMUL
INTRAVENOUS | Status: DC | PRN
  Administered 2023-08-18: 200 ug/kg/min via INTRAVENOUS
  Administered 2023-08-18: 120 mg via INTRAVENOUS

## 2023-08-18 NOTE — Op Note (Signed)
[facility] Gastroenterology Patient Name: Dawn Jones Procedure Date: 08/18/2023 12:26 PM MRN: [id number] Account #: [id number] Date of Birth: [birth year] Admit Type: Outpatient Age: 57 Room: [facility] ENDO ROOM 4 Gender: Female Note Status: Finalized Instrument Name: Upper Endoscope [id number] Procedure:             Upper GI endoscopy Indications:           Iron deficiency anemia secondary to chronic blood loss Providers:             Toney Reil MD, MD Referring MD:          Debera Lat (Referring MD) Medicines:             General Anesthesia Complications:         No immediate complications. Estimated blood loss: None. Procedure:             Pre-Anesthesia Assessment:                        - Prior to the procedure, a History and Physical was                         performed, and patient medications and allergies were                         reviewed. The patient is competent. The risks and                         benefits of the procedure and the sedation options and                         risks were discussed with the patient. All questions                         were answered and informed consent was obtained.                         Patient identification and proposed procedure were                         verified by the physician, the nurse, the                         anesthesiologist, the anesthetist and the technician                         in the pre-procedure area in the procedure room in the                         endoscopy suite. Mental Status Examination: alert and                         oriented. Airway Examination: normal oropharyngeal                         airway and neck mobility. Respiratory Examination:                         clear to auscultation. CV Examination: normal.  Prophylactic Antibiotics: The patient does not require                         prophylactic antibiotics. Prior Anticoagulants: The                          patient has taken no anticoagulant or antiplatelet                         agents. ASA Grade Assessment: I - A normal, healthy                         patient. After reviewing the risks and benefits, the                         patient was deemed in satisfactory condition to                         undergo the procedure. The anesthesia plan was to use                         general anesthesia. Immediately prior to                         administration of medications, the patient was                         re-assessed for adequacy to receive sedatives. The                         heart rate, respiratory rate, oxygen saturations,                         blood pressure, adequacy of pulmonary ventilation, and                         response to care were monitored throughout the                         procedure. The physical status of the patient was                         re-assessed after the procedure.                        After obtaining informed consent, the endoscope was                         passed under direct vision. Throughout the procedure,                         the patient's blood pressure, pulse, and oxygen                         saturations were monitored continuously. The Endoscope                         was introduced through the mouth, and advanced to the  third part of duodenum. The upper GI endoscopy was                         accomplished without difficulty. The patient tolerated                         the procedure well. Findings:      A large non-bleeding diverticulum was found in the second portion of the       duodenum.      The duodenal bulb, second portion of the duodenum and third portion of       the duodenum were normal. Biopsies for histology were taken with a cold       forceps for evaluation of celiac disease.      The entire examined stomach was normal. Biopsies were taken with a cold       forceps for  histology.      The cardia and gastric fundus were normal on retroflexion.      Esophagogastric landmarks were identified: the gastroesophageal junction       was found at 37 cm from the incisors.      The gastroesophageal junction and examined esophagus were normal. Impression:            - Non-bleeding duodenal diverticulum.                        - Normal duodenal bulb, second portion of the duodenum                         and third portion of the duodenum. Biopsied.                        - Normal stomach. Biopsied.                        - Esophagogastric landmarks identified.                        - Normal gastroesophageal junction and esophagus. Recommendation:        - Await pathology results.                        - Discharge patient to home (with escort).                        - Resume previous diet today.                        - Continue present medications.                        - Return to my office in 3 months. Procedure Code(s):     --- Professional ---                        [phone], Esophagogastroduodenoscopy, flexible,                         transoral; with biopsy, single or multiple Diagnosis Code(s):     --- Professional ---  D50.0, Iron deficiency anemia secondary to blood loss                         (chronic)                        K57.10, Diverticulosis of small intestine without                         perforation or abscess without bleeding CPT copyright 2022 American Medical Association. All rights reserved. The codes documented in this report are preliminary and upon coder review may  be revised to meet current compliance requirements. Dr. Libby Maw Toney Reil MD, MD 08/18/2023 12:45:26 PM This report has been signed electronically. Number of Addenda: 0 Note Initiated On: 08/18/2023 12:26 PM Estimated Blood Loss:  Estimated blood loss: none.      [facility]

## 2023-08-18 NOTE — Telephone Encounter (Signed)
Trish left a message on my voicemail and states the patient states that a iron pill was supposed to be called in to the pharmacy. Patient is getting ready to leave. Called patient back and left a detail message and informed patient this was called in to the pharmacy and told her what pharmacy. Informed her that it might not be cover by insurance.

## 2023-08-18 NOTE — Transfer of Care (Signed)
Immediate Anesthesia Transfer of Care Note  Patient: Dawn Jones  Procedure(s) Performed: ESOPHAGOGASTRODUODENOSCOPY (EGD) WITH PROPOFOL BIOPSY  Patient Location: PACU  Anesthesia Type:General  Level of Consciousness: awake  Airway & Oxygen Therapy: Patient Spontanous Breathing  Post-op Assessment: Report given to RN and Post -op Vital signs reviewed and stable  Post vital signs: Reviewed and stable  Last Vitals:  Vitals Value Taken Time  BP    Temp    Pulse 71 08/18/23 1246  Resp 9 08/18/23 1246  SpO2 96 % 08/18/23 1246  Vitals shown include unfiled device data.  Last Pain:  Vitals:   08/18/23 1154  TempSrc: Temporal  PainSc: 0-No pain         Complications: There were no known notable events for this encounter.

## 2023-08-18 NOTE — Anesthesia Postprocedure Evaluation (Signed)
Anesthesia Post Note  Patient: Dawn Jones  Procedure(s) Performed: ESOPHAGOGASTRODUODENOSCOPY (EGD) WITH PROPOFOL BIOPSY  Patient location during evaluation: Endoscopy Anesthesia Type: General Level of consciousness: awake and alert Pain management: pain level controlled Vital Signs Assessment: post-procedure vital signs reviewed and stable Respiratory status: spontaneous breathing, nonlabored ventilation, respiratory function stable and patient connected to nasal cannula oxygen Cardiovascular status: blood pressure returned to baseline and stable Postop Assessment: no apparent nausea or vomiting Anesthetic complications: no   There were no known notable events for this encounter.   Last Vitals:  Vitals:   08/18/23 1257 08/18/23 1307  BP: 117/75 114/73  Pulse: 72 (!) 59  Resp: (!) 21 19  Temp:    SpO2: 100% 98%    Last Pain:  Vitals:   08/18/23 1307  TempSrc:   PainSc: 0-No pain                 Louie Boston

## 2023-08-18 NOTE — H&P (Signed)
Arlyss Repress, MD [address]  [address]  [place], [place] [address]  Main: [phone]  Fax: [phone] Pager: [phone]  Primary Care Physician:  Debera Lat, PA-C Primary Gastroenterologist:  Dr. Arlyss Repress  Pre-Procedure History & Physical: HPI:  Dawn Jones is a 57 y.o. female is here for an endoscopy.   Past Medical History:  Diagnosis Date   S/P laparoscopic cholecystectomy     Past Surgical History:  Procedure Laterality Date   CHOLECYSTECTOMY     COLONOSCOPY WITH PROPOFOL N/A 08/11/2023   Procedure: COLONOSCOPY WITH PROPOFOL;  Surgeon: Toney Reil, MD;  Location: [facility] ENDOSCOPY;  Service: Gastroenterology;  Laterality: N/A;   HEMOSTASIS CONTROL  08/11/2023   Procedure: HEMOSTASIS CONTROL;  Surgeon: Toney Reil, MD;  Location: [facility] ENDOSCOPY;  Service: Gastroenterology;;    Prior to Admission medications   Medication Sig Start Date End Date Taking? Authorizing Provider  Iron-FA-B Cmp-C-Biot-Probiotic (FUSION PLUS) CAPS Take 1 capsule by mouth every other day. 08/11/23  Yes Toney Reil, MD    Allergies as of 08/11/2023   (No Known Allergies)    Family History  Problem Relation Age of Onset   Heart attack Mother 59   Heart attack Brother 3   Heart attack Maternal Grandfather     Social History   Socioeconomic History   Marital status: Married    Spouse name: Not on file   Number of children: Not on file   Years of education: Not on file   Highest education level: Some college, no degree  Occupational History   Not on file  Tobacco Use   Smoking status: Former    Types: Cigarettes   Smokeless tobacco: Never   Tobacco comments:    Quit 2021  Vaping Use   Vaping status: Some Days  Substance and Sexual Activity   Alcohol use: Never   Drug use: Never   Sexual activity: Yes    Birth control/protection: Post-menopausal  Other Topics Concern   Not on file  Social History Narrative   Not on file    Social Determinants of Health   Financial Resource Strain: Low Risk  (06/09/2023)   Overall Financial Resource Strain (CARDIA)    Difficulty of Paying Living Expenses: Not hard at all  Food Insecurity: No Food Insecurity (06/09/2023)   Hunger Vital Sign    Worried About Running Out of Food in the Last Year: Never true    Ran Out of Food in the Last Year: Never true  Transportation Needs: No Transportation Needs (06/09/2023)   PRAPARE - Administrator, Civil Service (Medical): No    Lack of Transportation (Non-Medical): No  Physical Activity: Sufficiently Active (06/09/2023)   Exercise Vital Sign    Days of Exercise per Week: 7 days    Minutes of Exercise per Session: 60 min  Stress: Stress Concern Present (06/09/2023)   [employer] of Occupational Health - Occupational Stress Questionnaire    Feeling of Stress : To some extent  Social Connections: Unknown (06/09/2023)   Social Connection and Isolation Panel [NHANES]    Frequency of Communication with Friends and Family: More than three times a week    Frequency of Social Gatherings with Friends and Family: Three times a week    Attends Religious Services: Patient declined    Active Member of Clubs or Organizations: No    Attends Banker Meetings: Not on file    Marital Status: Patient declined  Intimate Programme researcher, broadcasting/film/video  Violence: Not on file    Review of Systems: See HPI, otherwise negative ROS  Physical Exam: BP 134/85   Pulse 65   Temp (!) 96.8 F (36 C) (Temporal)   Resp 16   Ht 5\' 1"  (1.549 m)   Wt 63 kg   SpO2 100%   BMI 26.26 kg/m  General:   Alert,  pleasant and cooperative in NAD Head:  Normocephalic and atraumatic. Neck:  Supple; no masses or thyromegaly. Lungs:  Clear throughout to auscultation.    Heart:  Regular rate and rhythm. Abdomen:  Soft, nontender and nondistended. Normal bowel sounds, without guarding, and without rebound.   Neurologic:  Alert and  oriented x4;  grossly normal  neurologically.  Impression/Plan: Dawn Jones is here for an endoscopy to be performed for IDA  Risks, benefits, limitations, and alternatives regarding  endoscopy have been reviewed with the patient.  Questions have been answered.  All parties agreeable.   Lannette Donath, MD  08/18/2023, 11:56 AM

## 2023-08-18 NOTE — Anesthesia Preprocedure Evaluation (Signed)
 Anesthesia Evaluation  Patient identified by MRN, date of birth, ID band Patient awake    Reviewed: Allergy & Precautions, NPO status , Patient's Chart, lab work & pertinent test results  History of Anesthesia Complications Negative for: history of anesthetic complications  Airway Mallampati: III  TM Distance: >3 FB Neck ROM: full    Dental  (+) Chipped, Partial Upper, Partial Lower, Poor Dentition, Missing   Pulmonary neg shortness of breath, former smoker   Pulmonary exam normal        Cardiovascular Exercise Tolerance: Good (-) angina negative cardio ROS Normal cardiovascular exam     Neuro/Psych negative neurological ROS  negative psych ROS   GI/Hepatic negative GI ROS, Neg liver ROS,neg GERD  ,,  Endo/Other  negative endocrine ROS    Renal/GU negative Renal ROS  negative genitourinary   Musculoskeletal   Abdominal   Peds  Hematology negative hematology ROS (+)   Anesthesia Other Findings Past Medical History: No date: S/P laparoscopic cholecystectomy  Past Surgical History: No date: CHOLECYSTECTOMY  BMI    Body Mass Index: 25.06 kg/m      Reproductive/Obstetrics negative OB ROS                             Anesthesia Physical Anesthesia Plan  ASA: 1  Anesthesia Plan: General   Post-op Pain Management:    Induction: Intravenous  PONV Risk Score and Plan: Propofol infusion and TIVA  Airway Management Planned: Natural Airway and Nasal Cannula  Additional Equipment:   Intra-op Plan:   Post-operative Plan:   Informed Consent: I have reviewed the patients History and Physical, chart, labs and discussed the procedure including the risks, benefits and alternatives for the proposed anesthesia with the patient or authorized representative who has indicated his/her understanding and acceptance.     Dental Advisory Given  Plan Discussed with: Anesthesiologist, CRNA  and Surgeon  Anesthesia Plan Comments: (Patient consented for risks of anesthesia including but not limited to:  - adverse reactions to medications - risk of airway placement if required - damage to eyes, teeth, lips or other oral mucosa - nerve damage due to positioning  - sore throat or hoarseness - Damage to heart, brain, nerves, lungs, other parts of body or loss of life  Patient voiced understanding and assent.)       Anesthesia Quick Evaluation

## 2023-08-19 ENCOUNTER — Encounter: Payer: Self-pay | Admitting: Gastroenterology

## 2023-08-19 LAB — SURGICAL PATHOLOGY

## 2023-08-20 ENCOUNTER — Telehealth: Payer: Self-pay

## 2023-08-20 ENCOUNTER — Other Ambulatory Visit: Payer: Self-pay

## 2023-08-20 DIAGNOSIS — D500 Iron deficiency anemia secondary to blood loss (chronic): Secondary | ICD-10-CM

## 2023-08-20 NOTE — Telephone Encounter (Signed)
-----   Message from [facility] sent at 08/19/2023  4:27 PM EST ----- Please inform patient the pathology results from upper endoscopy came back normal.  Remind her about taking fusion plus every other day.  Recommend video capsule endoscopy to evaluate for any small bowel AVMs if patient is agreeable.   Rohini [employer]

## 2023-08-20 NOTE — Telephone Encounter (Signed)
Called and left a message for call back  

## 2023-08-20 NOTE — Telephone Encounter (Signed)
Patient verbalized understanding of results. He is okay with scheduling the capsule study. Schedule for 09/15/2023. Went over instructions, mailed them and sent to [employer]

## 2023-09-08 ENCOUNTER — Encounter: Payer: Self-pay | Admitting: Gastroenterology

## 2023-09-10 DIAGNOSIS — Z419 Encounter for procedure for purposes other than remedying health state, unspecified: Secondary | ICD-10-CM | POA: Diagnosis not present

## 2023-09-18 ENCOUNTER — Other Ambulatory Visit: Payer: Self-pay

## 2023-09-19 ENCOUNTER — Encounter: Payer: Self-pay | Admitting: Gastroenterology

## 2023-09-21 NOTE — Progress Notes (Signed)
 Dawn Console, PA-C [address]  [address]  [place], [place] [address]  Main: [phone]  Fax: [phone]   Primary Care Physician: Dineen Channel, PA-C  Primary Gastroenterologist:  Dawn Console, PA-C / Dr. Corinn Brooklyn    CC: Follow-up iron deficiency anemia  HPI: Dawn Jones is a 58 y.o. female returns for follow-up of iron deficiency anemia  08/11/2023 colonoscopy by Dr. Brooklyn: Good prep; multiple large nonbleeding colon angioectasias treated with argon plasma coagulation.  No polyps.  No biopsies.  08/18/2023 EGD by Dr. Brooklyn: A large nonbleeding diverticulum in the second portion of duodenum.  Otherwise normal.  No source for bleeding.  Biopsies negative for celiac and H. pylori.  Patient was given Givens capsule this morning, currently performing capsule endoscopy study.  Results pending.  08/11/2023 labs: Hemoglobin 10.1, MCV 78, normal vitamin B12 of 766, folate 20, low iron 37, iron saturation 7%, ferritin 4.  She was started on fusion plus iron tablet.  Currently she is feeling better since starting iron.  Energy is improving.  She has not had any GI symptoms such as melena, hematochezia, abdominal pain, diarrhea, constipation, nausea, or vomiting.  She took [facility] powders in the past.  She has stopped all NSAIDs since her GI procedures.  No current outpatient medications on file.   No current facility-administered medications for this visit.    Allergies as of 09/22/2023   (No Known Allergies)    Past Medical History:  Diagnosis Date   S/P laparoscopic cholecystectomy     Past Surgical History:  Procedure Laterality Date   BIOPSY  08/18/2023   Procedure: BIOPSY;  Surgeon: Brooklyn Corinn Skiff, MD;  Location: [facility] ENDOSCOPY;  Service: Endoscopy;;   CHOLECYSTECTOMY     COLONOSCOPY WITH PROPOFOL  N/A 08/11/2023   Procedure: COLONOSCOPY WITH PROPOFOL ;  Surgeon: Brooklyn Corinn Skiff, MD;  Location: [facility] ENDOSCOPY;  Service: Gastroenterology;  Laterality:  N/A;   ESOPHAGOGASTRODUODENOSCOPY (EGD) WITH PROPOFOL  N/A 08/18/2023   Procedure: ESOPHAGOGASTRODUODENOSCOPY (EGD) WITH PROPOFOL ;  Surgeon: Brooklyn Corinn Skiff, MD;  Location: [facility] ENDOSCOPY;  Service: Endoscopy;  Laterality: N/A;   HEMOSTASIS CONTROL  08/11/2023   Procedure: HEMOSTASIS CONTROL;  Surgeon: Brooklyn Corinn Skiff, MD;  Location: [facility] ENDOSCOPY;  Service: Gastroenterology;;    Review of Systems:    All systems reviewed and negative except where noted in HPI.   Physical Examination:   BP 137/73   Pulse 65   Temp 98.7 F (37.1 C)   Ht 5' 0.2 (1.529 m)   Wt 143 lb 12.8 oz (65.2 kg)   BMI 27.90 kg/m   General: Well-nourished, well-developed in no acute distress.  Lungs: Clear to auscultation bilaterally. Non-labored. Heart: Regular rate and rhythm, no murmurs rubs or gallops.  Abdomen: Bowel sounds are normal; Abdomen is Soft; No hepatosplenomegaly, masses or hernias;  No Abdominal Tenderness; No guarding or rebound tenderness. Neuro: Alert and oriented x 3.  Grossly intact.  Psych: Alert and cooperative, normal mood and affect.   Imaging Studies: No results found.  Assessment and Plan:   Dawn Jones is a 58 y.o. y/o female returns for follow-up of iron deficiency anemia.  Recent colonoscopy showed large bleeding AVMs which were treated with plasma argon coagulation.  EGD was normal.  Currently undergoing capsule endoscopy to evaluate for small bowel AVMs.  Recently started oral iron.  She is feeling better.  Energy has improved.  No GI symptoms.  1.  Iron deficiency anemia  Continue fusion plus  Repeat labs: CBC,  iron panel, ferritin  We will follow-up with capsule endoscopy study results  Avoid NSAIDs  Decide about continuing or stopping Iron based on lab results.  2.  GI bleeding from AVMs in the colon recently treated with argon plasma coagulation (colonoscopy 08/11/2023).  EGD was normal.   Dawn Console, PA-C  Follow up as needed.

## 2023-09-22 ENCOUNTER — Ambulatory Visit (INDEPENDENT_AMBULATORY_CARE_PROVIDER_SITE_OTHER): Payer: Medicaid Other | Admitting: Physician Assistant

## 2023-09-22 ENCOUNTER — Ambulatory Visit
Admission: RE | Admit: 2023-09-22 | Discharge: 2023-09-22 | Disposition: A | Discharge status: Home / Self Care | Payer: Medicaid Other | Attending: Gastroenterology | Admitting: Gastroenterology

## 2023-09-22 ENCOUNTER — Encounter
Admission: RE | Disposition: A | Discharge status: Home / Self Care | Payer: Self-pay | Source: Home / Self Care | Attending: Gastroenterology

## 2023-09-22 ENCOUNTER — Encounter: Payer: Self-pay | Admitting: Physician Assistant

## 2023-09-22 ENCOUNTER — Encounter: Payer: Self-pay | Admitting: Anesthesiology

## 2023-09-22 VITALS — BP 137/73 | HR 65 | Temp 98.7°F | Ht 60.2 in | Wt 143.8 lb

## 2023-09-22 DIAGNOSIS — D500 Iron deficiency anemia secondary to blood loss (chronic): Secondary | ICD-10-CM | POA: Diagnosis not present

## 2023-09-22 DIAGNOSIS — Z8719 Personal history of other diseases of the digestive system: Secondary | ICD-10-CM

## 2023-09-22 DIAGNOSIS — K5520 Angiodysplasia of colon without hemorrhage: Secondary | ICD-10-CM | POA: Diagnosis not present

## 2023-09-22 DIAGNOSIS — D509 Iron deficiency anemia, unspecified: Secondary | ICD-10-CM | POA: Diagnosis not present

## 2023-09-22 DIAGNOSIS — K5521 Angiodysplasia of colon with hemorrhage: Secondary | ICD-10-CM

## 2023-09-22 HISTORY — PX: GIVENS CAPSULE STUDY: SHX5432

## 2023-09-22 SURGERY — GIVENS CAPSULE STUDY

## 2023-09-22 NOTE — Anesthesia Preprocedure Evaluation (Signed)
 Anesthesia Evaluation  Patient identified by MRN, date of birth, ID band Patient awake    Reviewed: Allergy & Precautions, H&P , NPO status , Patient's Chart, lab work & pertinent test results, reviewed documented beta blocker date and time   Airway Mallampati: II   Neck ROM: full    Dental  (+) Poor Dentition   Pulmonary neg pulmonary ROS, former smoker   Pulmonary exam normal        Cardiovascular Exercise Tolerance: Poor negative cardio ROS Normal cardiovascular exam Rhythm:regular Rate:Normal     Neuro/Psych negative neurological ROS  negative psych ROS   GI/Hepatic negative GI ROS, Neg liver ROS,,,  Endo/Other  negative endocrine ROS    Renal/GU negative Renal ROS  negative genitourinary   Musculoskeletal   Abdominal   Peds  Hematology  (+) Blood dyscrasia, anemia   Anesthesia Other Findings Past Medical History: 08/11/2023: AVM (arteriovenous malformation) of colon without  hemorrhage 08/2023: GI bleeding 08/2023: Iron deficiency anemia No date: S/P laparoscopic cholecystectomy Past Surgical History: 08/18/2023: BIOPSY     Comment:  Procedure: BIOPSY;  Surgeon: Unk Corinn Skiff, MD;                Location: [facility] ENDOSCOPY;  Service: Endoscopy;; No date: CHOLECYSTECTOMY 08/11/2023: COLONOSCOPY WITH PROPOFOL ; N/A     Comment:  Procedure: COLONOSCOPY WITH PROPOFOL ;  Surgeon: Unk Corinn Skiff, MD;  Location: [facility] ENDOSCOPY;  Service:               Gastroenterology;  Laterality: N/A; 08/18/2023: ESOPHAGOGASTRODUODENOSCOPY (EGD) WITH PROPOFOL ; N/A     Comment:  Procedure: ESOPHAGOGASTRODUODENOSCOPY (EGD) WITH               PROPOFOL ;  Surgeon: Unk Corinn Skiff, MD;  Location:               [facility] ENDOSCOPY;  Service: Endoscopy;  Laterality: N/A; 08/11/2023: HEMOSTASIS CONTROL     Comment:  Procedure: HEMOSTASIS CONTROL;  Surgeon: Unk Corinn Skiff, MD;  Location: [facility]  ENDOSCOPY;  Service:               Gastroenterology;;   Reproductive/Obstetrics negative OB ROS                             Anesthesia Physical Anesthesia Plan  ASA: 3 and emergent  Anesthesia Plan: General   Post-op Pain Management:    Induction:   PONV Risk Score and Plan:   Airway Management Planned:   Additional Equipment:   Intra-op Plan:   Post-operative Plan:   Informed Consent: I have reviewed the patients History and Physical, chart, labs and discussed the procedure including the risks, benefits and alternatives for the proposed anesthesia with the patient or authorized representative who has indicated his/her understanding and acceptance.     Dental Advisory Given  Plan Discussed with: CRNA  Anesthesia Plan Comments:        Anesthesia Quick Evaluation

## 2023-09-23 ENCOUNTER — Encounter: Payer: Self-pay | Admitting: Gastroenterology

## 2023-09-23 LAB — CBC WITH DIFFERENTIAL/PLATELET
Basophils Absolute: 0 10*3/uL (ref 0.0–0.2)
Basos: 1 %
EOS (ABSOLUTE): 0.1 10*3/uL (ref 0.0–0.4)
Eos: 1 %
Hematocrit: 37.5 % (ref 34.0–46.6)
Hemoglobin: 11.4 g/dL (ref 11.1–15.9)
Immature Grans (Abs): 0 10*3/uL (ref 0.0–0.1)
Immature Granulocytes: 0 %
Lymphocytes Absolute: 1.8 10*3/uL (ref 0.7–3.1)
Lymphs: 26 %
MCH: 24.4 pg — ABNORMAL LOW (ref 26.6–33.0)
MCHC: 30.4 g/dL — ABNORMAL LOW (ref 31.5–35.7)
MCV: 80 fL (ref 79–97)
Monocytes Absolute: 0.4 10*3/uL (ref 0.1–0.9)
Monocytes: 5 %
Neutrophils Absolute: 4.6 10*3/uL (ref 1.4–7.0)
Neutrophils: 67 %
Platelets: 284 10*3/uL (ref 150–450)
RBC: 4.68 x10E6/uL (ref 3.77–5.28)
RDW: 16.7 % — ABNORMAL HIGH (ref 11.7–15.4)
WBC: 6.9 10*3/uL (ref 3.4–10.8)

## 2023-09-23 LAB — IRON,TIBC AND FERRITIN PANEL
Ferritin: 20 ng/mL (ref 15–150)
Iron Saturation: 18 % (ref 15–55)
Iron: 68 ug/dL (ref 27–159)
Total Iron Binding Capacity: 382 ug/dL (ref 250–450)
UIBC: 314 ug/dL (ref 131–425)

## 2023-09-24 ENCOUNTER — Encounter: Payer: Self-pay | Admitting: Gastroenterology

## 2023-10-11 DIAGNOSIS — Z419 Encounter for procedure for purposes other than remedying health state, unspecified: Secondary | ICD-10-CM | POA: Diagnosis not present

## 2023-11-08 DIAGNOSIS — Z419 Encounter for procedure for purposes other than remedying health state, unspecified: Secondary | ICD-10-CM | POA: Diagnosis not present

## 2023-11-19 ENCOUNTER — Ambulatory Visit: Payer: Medicaid Other | Admitting: Physician Assistant

## 2023-12-15 ENCOUNTER — Ambulatory Visit: Payer: Self-pay | Admitting: Physician Assistant

## 2023-12-20 DIAGNOSIS — Z419 Encounter for procedure for purposes other than remedying health state, unspecified: Secondary | ICD-10-CM | POA: Diagnosis not present

## 2024-01-12 ENCOUNTER — Ambulatory Visit: Admitting: Physician Assistant

## 2024-01-12 NOTE — Progress Notes (Deleted)
 Established patient visit  Patient: Dawn Jones   DOB: Feb 23, 1966   58 y.o. Female  MRN: [id number] Visit Date: 01/12/2024  Today's healthcare provider: Blane Bunting, PA-C   No chief complaint on file.  Subjective       Discussed the use of AI scribe software for clinical note transcription with the patient, who gave verbal consent to proceed.  History of Present Illness        05/20/2023    3:33 PM  Depression screen PHQ 2/9  Decreased Interest 0  Down, Depressed, Hopeless 0  PHQ - 2 Score 0       No data to display          Medications: Outpatient Medications Prior to Visit  Medication Sig  . Iron-FA-B Cmp-C-Biot-Probiotic (FUSION PLUS) CAPS Take 1 capsule by mouth every other day.   No facility-administered medications prior to visit.    Review of Systems  All other systems reviewed and are negative. All negative Except see HPI   {Insert previous labs (optional):23779} {See past labs  Heme  Chem  Endocrine  Serology  Results Review (optional):1}   Objective    There were no vitals taken for this visit. {Insert last BP/Wt (optional):23777}{See vitals history (optional):1}   Physical Exam Vitals reviewed.  Constitutional:      General: She is not in acute distress.    Appearance: Normal appearance. She is well-developed. She is not diaphoretic.  HENT:     Head: Normocephalic and atraumatic.  Eyes:     General: No scleral icterus.    Conjunctiva/sclera: Conjunctivae normal.  Neck:     Thyroid : No thyromegaly.  Cardiovascular:     Rate and Rhythm: Normal rate and regular rhythm.     Pulses: Normal pulses.     Heart sounds: Normal heart sounds. No murmur heard. Pulmonary:     Effort: Pulmonary effort is normal. No respiratory distress.     Breath sounds: Normal breath sounds. No wheezing, rhonchi or rales.  Musculoskeletal:     Cervical back: Neck supple.     Right lower leg: No edema.     Left lower leg: No edema.   Lymphadenopathy:     Cervical: No cervical adenopathy.  Skin:    General: Skin is warm and dry.     Findings: No rash.  Neurological:     Mental Status: She is alert and oriented to person, place, and time. Mental status is at baseline.  Psychiatric:        Mood and Affect: Mood normal.        Behavior: Behavior normal.     No results found for any visits on 01/12/24.      Assessment and Plan Assessment & Plan     No orders of the defined types were placed in this encounter.   No follow-ups on file.   The patient was advised to call back or seek an in-person evaluation if the symptoms worsen or if the condition fails to improve as anticipated.  I discussed the assessment and treatment plan with the patient. The patient was provided an opportunity to ask questions and all were answered. The patient agreed with the plan and demonstrated an understanding of the instructions.  I, Antonietta Lansdowne, PA-C have reviewed all documentation for this visit. The documentation on 01/12/2024  for the exam, diagnosis, procedures, and orders are all accurate and complete.  Blane Bunting, [facility], MMS [facility] [phone] (phone) [phone] (fax)  [facility]

## 2024-01-19 DIAGNOSIS — Z419 Encounter for procedure for purposes other than remedying health state, unspecified: Secondary | ICD-10-CM | POA: Diagnosis not present

## 2024-02-16 ENCOUNTER — Ambulatory Visit (INDEPENDENT_AMBULATORY_CARE_PROVIDER_SITE_OTHER): Admitting: Physician Assistant

## 2024-02-16 VITALS — BP 130/87 | Resp 16 | Ht 62.0 in | Wt 145.4 lb

## 2024-02-16 DIAGNOSIS — K922 Gastrointestinal hemorrhage, unspecified: Secondary | ICD-10-CM

## 2024-02-16 DIAGNOSIS — J3089 Other allergic rhinitis: Secondary | ICD-10-CM | POA: Diagnosis not present

## 2024-02-16 DIAGNOSIS — R030 Elevated blood-pressure reading, without diagnosis of hypertension: Secondary | ICD-10-CM | POA: Diagnosis not present

## 2024-02-16 DIAGNOSIS — R5383 Other fatigue: Secondary | ICD-10-CM

## 2024-02-16 DIAGNOSIS — R7303 Prediabetes: Secondary | ICD-10-CM

## 2024-02-16 DIAGNOSIS — E049 Nontoxic goiter, unspecified: Secondary | ICD-10-CM | POA: Insufficient documentation

## 2024-02-16 DIAGNOSIS — M25561 Pain in right knee: Secondary | ICD-10-CM

## 2024-02-16 DIAGNOSIS — K5520 Angiodysplasia of colon without hemorrhage: Secondary | ICD-10-CM

## 2024-02-16 DIAGNOSIS — D500 Iron deficiency anemia secondary to blood loss (chronic): Secondary | ICD-10-CM

## 2024-02-16 DIAGNOSIS — J309 Allergic rhinitis, unspecified: Secondary | ICD-10-CM | POA: Insufficient documentation

## 2024-02-16 NOTE — Progress Notes (Signed)
 Established patient visit  Patient: Dawn Jones   DOB: [birth year]   58 y.o. Female  MRN: [id number] Visit Date: 02/16/2024  Today's healthcare provider: Blane Bunting, PA-C   Chief Complaint  Patient presents with   Follow-up    F/u.Aaron Aas Neck pain   Subjective     HPI     Follow-up    Additional comments: F/u.Aaron Aas Neck pain      Last edited by Estill Hemming, CMA on 02/16/2024  1:09 PM.       Discussed the use of AI scribe software for clinical note transcription with the patient, who gave verbal consent to proceed.  History of Present Illness Dawn Jones is a 58 year old female with a history of gastrointestinal issues who presents with neck pain and follow-up for previous gastrointestinal issues.  She experiences neck pain, previously treated with a Durezol injection for a pinched nerve, which was effective. Her gastrointestinal history includes non-bleeding blood vessels found during endoscopy, a colonoscopy, upper endoscopy, and capsule endoscopy. Her iron level was previously low but has improved, with no current symptoms of gastrointestinal bleeding or distress.  Recently, her dog jumped on her knee, causing a popping sound, swelling, and pain, which resolved after four days. She has prediabetes with a positive A1c test but is not on medication. She exercises regularly and has noted slight weight gain, likely due to menopause. Her blood pressure at home is around 118/80. She has a history of sinusitis but no current symptoms.       05/20/2023    3:33 PM  Depression screen PHQ 2/9  Decreased Interest 0  Down, Depressed, Hopeless 0  PHQ - 2 Score 0       No data to display          Medications: Outpatient Medications Prior to Visit  Medication Sig   Iron-FA-B Cmp-C-Biot-Probiotic (FUSION PLUS) CAPS Take 1 capsule by mouth every other day.   No facility-administered medications prior to visit.    Review of Systems All negative Except see HPI        Objective    BP 130/87 (BP Location: Left Arm, Patient Position: Sitting)   Resp 16   Ht 5\' 2"  (1.575 m)   Wt 145 lb 6.4 oz (66 kg)   SpO2 97%   BMI 26.59 kg/m     Physical Exam Vitals reviewed.  Constitutional:      General: She is not in acute distress.    Appearance: Normal appearance. She is well-developed. She is not diaphoretic.  HENT:     Head: Normocephalic and atraumatic.  Eyes:     General: No scleral icterus.    Conjunctiva/sclera: Conjunctivae normal.  Neck:     Thyroid : No thyromegaly.  Cardiovascular:     Rate and Rhythm: Normal rate and regular rhythm.     Pulses: Normal pulses.     Heart sounds: Normal heart sounds. No murmur heard. Pulmonary:     Effort: Pulmonary effort is normal. No respiratory distress.     Breath sounds: Normal breath sounds. No wheezing, rhonchi or rales.  Musculoskeletal:     Cervical back: Neck supple.     Right lower leg: No edema.     Left lower leg: No edema.  Lymphadenopathy:     Cervical: No cervical adenopathy.  Skin:    General: Skin is warm and dry.     Findings: No rash.  Neurological:     Mental Status: She is alert and  oriented to person, place, and time. Mental status is at baseline.  Psychiatric:        Mood and Affect: Mood normal.        Behavior: Behavior normal.      No results found for any visits on 02/16/24.      Assessment & Plan Knee pain and swelling Knee swelling and pain after trauma, requires further evaluation. - Refer to [facility] for evaluation. - Request release of evaluation results to primary care.  Elevated bp reading Blood pressure at 130/87 mmHg, hypertensive. Usual BP <130/80. - Encourage regular blood pressure monitoring. - Advise low-salt diet.  Prediabetes Chronic A1c previously positive, indicating prediabetes. Slight weight gain noted. - Recheck A1c level. - Encourage continuation of regular exercise. - Advise low-salt diet.  Gastrointestinal  bleeding Previous GI bleeding resolved, improved energy, no current symptoms. - Ensure follow-up with gastroenterology as needed.  Allergic rhinitis  History of sinusitis, current nasal congestion. - Continue nasal saline rinse and spray. - Use Flonase as needed. Will follow-up  Thyroid  function Enlarged lymph nodes, possible upper respiratory infection. Thyroid  function to be evaluated. - Order TSH level to check thyroid  function. - Consider thyroid  ultrasound if TSH levels are abnormal.  General Health Maintenance Regular exercise, weight gain attributed to menopause. Established care with OBGYN, colonoscopy and endoscopy completed. - Encourage continuation of regular exercise. - Advise low-salt diet. - Schedule annual physical in October to include cholesterol check.  Follow-up Annual physical scheduled for comprehensive check-up. - Schedule annual physical for June 16, 2023. - Check thyroid  levels during the next visit. - Monitor blood pressure regularly.  Elevated blood pressure reading (Primary)  - CBC with Differential/Platelet - Iron, TIBC and Ferritin Panel - Hemoglobin A1c  Other fatigue  - TSH  Iron deficiency anemia due to chronic blood loss -cbc, iron panel  Goiter -tsh  AVM (arteriovenous malformation) of colon without hemorrhage Gastrointestinal hemorrhage, unspecified gastrointestinal hemorrhage type In the past, associated with fatigue and iron-def anemia Will follow-up   No orders of the defined types were placed in this encounter.   No follow-ups on file.   The patient was advised to call back or seek an in-person evaluation if the symptoms worsen or if the condition fails to improve as anticipated.  I discussed the assessment and treatment plan with the patient. The patient was provided an opportunity to ask questions and all were answered. The patient agreed with the plan and demonstrated an understanding of the instructions.  I, Margart Zemanek, PA-C have reviewed all documentation for this visit. The documentation on 02/16/2024  for the exam, diagnosis, procedures, and orders are all accurate and complete.  Blane Bunting, [facility], MMS [facility] [phone] (phone) [phone] (fax)  [facility]

## 2024-02-17 LAB — TSH: TSH: 0.754 u[IU]/mL (ref 0.450–4.500)

## 2024-02-19 DIAGNOSIS — Z419 Encounter for procedure for purposes other than remedying health state, unspecified: Secondary | ICD-10-CM | POA: Diagnosis not present

## 2024-03-20 DIAGNOSIS — Z419 Encounter for procedure for purposes other than remedying health state, unspecified: Secondary | ICD-10-CM | POA: Diagnosis not present

## 2024-04-20 DIAGNOSIS — Z419 Encounter for procedure for purposes other than remedying health state, unspecified: Secondary | ICD-10-CM | POA: Diagnosis not present

## 2024-05-21 DIAGNOSIS — Z419 Encounter for procedure for purposes other than remedying health state, unspecified: Secondary | ICD-10-CM | POA: Diagnosis not present

## 2024-06-21 ENCOUNTER — Encounter: Admitting: Physician Assistant

## 2024-07-07 ENCOUNTER — Other Ambulatory Visit (HOSPITAL_COMMUNITY): Payer: Self-pay | Admitting: Physician Assistant

## 2024-07-07 DIAGNOSIS — Z1231 Encounter for screening mammogram for malignant neoplasm of breast: Secondary | ICD-10-CM

## 2024-07-12 ENCOUNTER — Ambulatory Visit (HOSPITAL_COMMUNITY)
Admission: RE | Admit: 2024-07-12 | Discharge: 2024-07-12 | Disposition: A | Discharge status: Home / Self Care | Source: Ambulatory Visit | Attending: Hematology and Oncology | Admitting: Hematology and Oncology

## 2024-07-12 ENCOUNTER — Encounter (HOSPITAL_COMMUNITY): Payer: Self-pay

## 2024-07-12 DIAGNOSIS — Z1231 Encounter for screening mammogram for malignant neoplasm of breast: Secondary | ICD-10-CM | POA: Insufficient documentation

## 2024-07-15 ENCOUNTER — Ambulatory Visit: Payer: Self-pay | Admitting: Physician Assistant

## 2024-07-19 ENCOUNTER — Ambulatory Visit (INDEPENDENT_AMBULATORY_CARE_PROVIDER_SITE_OTHER): Admitting: Physician Assistant

## 2024-07-19 ENCOUNTER — Encounter: Payer: Self-pay | Admitting: Physician Assistant

## 2024-07-19 VITALS — BP 128/90 | HR 75 | Resp 14 | Ht 62.0 in | Wt 145.5 lb

## 2024-07-19 DIAGNOSIS — R5383 Other fatigue: Secondary | ICD-10-CM | POA: Diagnosis not present

## 2024-07-19 DIAGNOSIS — Z0000 Encounter for general adult medical examination without abnormal findings: Secondary | ICD-10-CM | POA: Insufficient documentation

## 2024-07-19 DIAGNOSIS — R7303 Prediabetes: Secondary | ICD-10-CM

## 2024-07-19 DIAGNOSIS — R030 Elevated blood-pressure reading, without diagnosis of hypertension: Secondary | ICD-10-CM

## 2024-07-19 DIAGNOSIS — E049 Nontoxic goiter, unspecified: Secondary | ICD-10-CM | POA: Diagnosis not present

## 2024-07-19 NOTE — Progress Notes (Signed)
 Complete physical exam  Patient: Dawn Jones   DOB: October 06, 1965   58 y.o. Female  MRN: [id number] Visit Date: 07/19/2024  Today's healthcare provider: Jolynn Spencer, PA-C   Chief Complaint  Patient presents with   Annual Exam   Subjective    Dawn Jones is a 58 y.o. female who presents today for a complete physical exam.   Discussed the use of AI scribe software for clinical note transcription with the patient, who gave verbal consent to proceed.  History of Present Illness Dawn Jones is a 58 year old female who presents for a routine follow-up visit.  She maintains an active lifestyle, walking nine to ten miles daily, adheres to a healthy diet, and experiences good sleep quality. She denies having depression.   Adheres to healthy diet, endorses having a good sleep  Declines vaccinations  Last depression screening scores    07/19/2024    1:33 PM 05/20/2023    3:33 PM  PHQ 2/9 Scores  PHQ - 2 Score 0 0  PHQ- 9 Score 0    Last fall risk screening    05/20/2023    3:33 PM  Fall Risk   Falls in the past year? 0  Injury with Fall? 0  Risk for fall due to : No Fall Risks  Follow up Falls evaluation completed   Last Audit-C alcohol use screening    02/16/2024   12:53 PM  Alcohol Use Disorder Test (AUDIT)  1. How often do you have a drink containing alcohol? 1  2. How many drinks containing alcohol do you have on a typical day when you are drinking? 0  3. How often do you have six or more drinks on one occasion? 0  AUDIT-C Score 1      Patient-reported   A score of 3 or more in women, and 4 or more in men indicates increased risk for alcohol abuse, EXCEPT if all of the points are from question 1   Past Medical History:  Diagnosis Date   AVM (arteriovenous malformation) of colon without hemorrhage 08/11/2023   GI bleeding 08/2023   Iron deficiency anemia 08/2023   S/P laparoscopic cholecystectomy    Past Surgical History:  Procedure  Laterality Date   BIOPSY  08/18/2023   Procedure: BIOPSY;  Surgeon: Unk Corinn Skiff, MD;  Location: [facility] ENDOSCOPY;  Service: Endoscopy;;   CHOLECYSTECTOMY     COLONOSCOPY WITH PROPOFOL  N/A 08/11/2023   Procedure: COLONOSCOPY WITH PROPOFOL ;  Surgeon: Unk Corinn Skiff, MD;  Location: [facility] ENDOSCOPY;  Service: Gastroenterology;  Laterality: N/A;   ESOPHAGOGASTRODUODENOSCOPY (EGD) WITH PROPOFOL  N/A 08/18/2023   Procedure: ESOPHAGOGASTRODUODENOSCOPY (EGD) WITH PROPOFOL ;  Surgeon: Unk Corinn Skiff, MD;  Location: [facility] ENDOSCOPY;  Service: Endoscopy;  Laterality: N/A;   GIVENS CAPSULE STUDY N/A 09/22/2023   Procedure: GIVENS CAPSULE STUDY;  Surgeon: Unk Corinn Skiff, MD;  Location: [facility];  Service: Gastroenterology;  Laterality: N/A;   HEMOSTASIS CONTROL  08/11/2023   Procedure: HEMOSTASIS CONTROL;  Surgeon: Unk Corinn Skiff, MD;  Location: [facility] ENDOSCOPY;  Service: Gastroenterology;;   Social History   Socioeconomic History   Marital status: Married    Spouse name: Not on file   Number of children: Not on file   Years of education: Not on file   Highest education level: Some college, no degree  Occupational History   Not on file  Tobacco Use   Smoking status: Former    Types: Cigarettes   Smokeless tobacco: Never  Tobacco comments:    Quit 2021  Vaping Use   Vaping status: Some Days  Substance and Sexual Activity   Alcohol use: Never   Drug use: Never   Sexual activity: Yes    Birth control/protection: Post-menopausal  Other Topics Concern   Not on file  Social History Narrative   Not on file   Social Drivers of Health   Financial Resource Strain: Low Risk  (02/16/2024)   Overall Financial Resource Strain (CARDIA)    Difficulty of Paying Living Expenses: Not hard at all  Food Insecurity: No Food Insecurity (02/16/2024)   Hunger Vital Sign    Worried About Running Out of Food in the Last Year: Never true    Ran Out of Food in the Last Year: Never true   Transportation Needs: No Transportation Needs (02/16/2024)   PRAPARE - Administrator, Civil Service (Medical): No    Lack of Transportation (Non-Medical): No  Physical Activity: Sufficiently Active (02/16/2024)   Exercise Vital Sign    Days of Exercise per Week: 7 days    Minutes of Exercise per Session: 40 min  Stress: No Stress Concern Present (02/16/2024)   [employer] of Occupational Health - Occupational Stress Questionnaire    Feeling of Stress : Only a little  Social Connections: Unknown (07/19/2024)   Social Connection and Isolation Panel    Frequency of Communication with Friends and Family: More than three times a week    Frequency of Social Gatherings with Friends and Family: More than three times a week    Attends Religious Services: Patient declined    Database Administrator or Organizations: No    Attends Banker Meetings: Patient declined    Marital Status: Patient declined  Intimate Partner Violence: Not At Risk (07/19/2024)   Humiliation, Afraid, Rape, and Kick questionnaire    Fear of Current or Ex-Partner: No    Emotionally Abused: No    Physically Abused: No    Sexually Abused: No   Family Status  Relation Name Status   Mother  (Not Specified)   Brother  (Not Specified)   MGF  (Not Specified)  No partnership data on file   Family History  Problem Relation Age of Onset   Heart attack Mother 51   Heart attack Brother 51   Heart attack Maternal Grandfather    No Known Allergies  Patient Care Team: Heman Que, PA-C as PCP - General (Physician Assistant)   Medications: Outpatient Medications Prior to Visit  Medication Sig   [DISCONTINUED] Iron-FA-B Cmp-C-Biot-Probiotic (FUSION PLUS) CAPS Take 1 capsule by mouth every other day.   No facility-administered medications prior to visit.    Review of Systems Except see HPI     Objective    BP (!) 128/90   Pulse 75   Resp 14   Ht 5' 2 (1.575 m)   Wt 145 lb 8 oz (66  kg)   SpO2 100%   BMI 26.61 kg/m      Physical Exam Vitals reviewed.  Constitutional:      General: She is not in acute distress.    Appearance: Normal appearance. She is well-developed. She is not ill-appearing, toxic-appearing or diaphoretic.  HENT:     Head: Normocephalic and atraumatic.     Right Ear: Tympanic membrane, ear canal and external ear normal.     Left Ear: Tympanic membrane, ear canal and external ear normal.     Nose: Nose normal. No congestion or  rhinorrhea.     Mouth/Throat:     Mouth: Mucous membranes are moist.     Pharynx: Oropharynx is clear. No oropharyngeal exudate.  Eyes:     General: No scleral icterus.       Right eye: No discharge.        Left eye: No discharge.     Conjunctiva/sclera: Conjunctivae normal.     Pupils: Pupils are equal, round, and reactive to light.  Neck:     Thyroid : No thyromegaly.     Vascular: No carotid bruit.  Cardiovascular:     Rate and Rhythm: Normal rate and regular rhythm.     Pulses: Normal pulses.     Heart sounds: Normal heart sounds. No murmur heard.    No friction rub. No gallop.  Pulmonary:     Effort: Pulmonary effort is normal. No respiratory distress.     Breath sounds: Normal breath sounds. No wheezing or rales.  Abdominal:     General: Abdomen is flat. Bowel sounds are normal. There is no distension.     Palpations: Abdomen is soft. There is no mass.     Tenderness: There is no abdominal tenderness. There is no right CVA tenderness, left CVA tenderness, guarding or rebound.     Hernia: No hernia is present.  Musculoskeletal:        General: No swelling, tenderness, deformity or signs of injury. Normal range of motion.     Cervical back: Normal range of motion and neck supple. No rigidity or tenderness.     Right lower leg: No edema.     Left lower leg: No edema.  Lymphadenopathy:     Cervical: No cervical adenopathy.  Skin:    General: Skin is warm and dry.     Coloration: Skin is not jaundiced or  pale.     Findings: No bruising, erythema, lesion or rash.  Neurological:     Mental Status: She is alert and oriented to person, place, and time. Mental status is at baseline.     Gait: Gait normal.  Psychiatric:        Mood and Affect: Mood normal.        Behavior: Behavior normal.        Thought Content: Thought content normal.        Judgment: Judgment normal.      No results found for any visits on 07/19/24.  Assessment & Plan    Routine Health Maintenance and Physical Exam  Exercise Activities and Dietary recommendations  Goals   None     Immunization History  Administered Date(s) Administered   PFIZER(Purple Top)SARS-COV-2 Vaccination 05/21/2020, 06/11/2020   Tdap 01/05/2017   Zoster Recombinant(Shingrix ) 05/20/2023    Health Maintenance  Topic Date Due   Hepatitis B Vaccine (1 of 3 - 19+ 3-dose series) Never done   Pneumococcal Vaccine for age over 105 (1 of 1 - PCV) Never done   Zoster (Shingles) Vaccine (2 of 2) 07/15/2023   COVID-19 Vaccine (3 - 2025-26 season) 05/10/2024   Flu Shot  12/07/2024*   Breast Cancer Screening  07/12/2026   DTaP/Tdap/Td vaccine (2 - Td or Tdap) 01/06/2027   Pap with HPV screening  08/03/2028   Colon Cancer Screening  08/10/2033   Hepatitis C Screening  Completed   HIV Screening  Completed   HPV Vaccine  Aged Out   Meningitis B Vaccine  Aged Out  *Topic was postponed. The date shown is not the original due date.  Discussed health benefits of physical activity, and encouraged her to engage in regular exercise appropriate for her age and condition.  Assessment and Plan Assessment & Plan Annual physical exam (Primary) Adult Wellness Visit Regular physical activity and healthy diet. Incomplete recent blood work. - Ordered A1c, iron panel, CBC, kidney function tests, and lipid panel.  Prediabetes In the past Continue with low carb diet and regular exercise - Ordered A1c test. Will follow-up  Prediabetes - Basic  metabolic panel with GFR - Lipid panel  Other fatigue Chronic, in the past could be due to iron def anemia Will proceed with iron panel, see above - Basic metabolic panel with GFR - Lipid panel Continue healthy diet and daily exercise Will follow-up  Elevated BP  Advised to continue BP monitoring If it stays high, follow-up with clinic Consider starting taking BP med Continue low sodium diet and daily exercise Will follow-up   No follow-ups on file.    The patient was advised to call back or seek an in-person evaluation if the symptoms worsen or if the condition fails to improve as anticipated.  I discussed the assessment and treatment plan with the patient. The patient was provided an opportunity to ask questions and all were answered. The patient agreed with the plan and demonstrated an understanding of the instructions.  I, Ruslan Mccabe, PA-C have reviewed all documentation for this visit. The documentation on 07/19/2024  for the exam, diagnosis, procedures, and orders are all accurate and complete.  Jolynn Spencer, [facility], MMS [facility] [phone] (phone) [phone] (fax)  [facility]

## 2024-07-20 ENCOUNTER — Ambulatory Visit: Payer: Self-pay | Admitting: Physician Assistant

## 2024-07-20 LAB — IRON,TIBC AND FERRITIN PANEL
Ferritin: 40 ng/mL (ref 15–150)
Iron Saturation: 18 % (ref 15–55)
Iron: 66 ug/dL (ref 27–159)
Total Iron Binding Capacity: 368 ug/dL (ref 250–450)
UIBC: 302 ug/dL (ref 131–425)

## 2024-07-20 LAB — BASIC METABOLIC PANEL WITH GFR
BUN/Creatinine Ratio: 25 — ABNORMAL HIGH (ref 9–23)
BUN: 17 mg/dL (ref 6–24)
CO2: 24 mmol/L (ref 20–29)
Calcium: 10.2 mg/dL (ref 8.7–10.2)
Chloride: 103 mmol/L (ref 96–106)
Creatinine, Ser: 0.67 mg/dL (ref 0.57–1.00)
Glucose: 91 mg/dL (ref 70–99)
Potassium: 4 mmol/L (ref 3.5–5.2)
Sodium: 140 mmol/L (ref 134–144)
eGFR: 103 mL/min/1.73 (ref >59)

## 2024-07-20 LAB — CBC WITH DIFFERENTIAL/PLATELET
Basophils Absolute: 0 x10E3/uL (ref 0.0–0.2)
Basos: 1 %
EOS (ABSOLUTE): 0.1 x10E3/uL (ref 0.0–0.4)
Eos: 1 %
Hematocrit: 40.8 % (ref 34.0–46.6)
Hemoglobin: 13.3 g/dL (ref 11.1–15.9)
Immature Grans (Abs): 0 x10E3/uL (ref 0.0–0.1)
Immature Granulocytes: 0 %
Lymphocytes Absolute: 2.2 x10E3/uL (ref 0.7–3.1)
Lymphs: 28 %
MCH: 27.4 pg (ref 26.6–33.0)
MCHC: 32.6 g/dL (ref 31.5–35.7)
MCV: 84 fL (ref 79–97)
Monocytes Absolute: 0.5 x10E3/uL (ref 0.1–0.9)
Monocytes: 6 %
Neutrophils Absolute: 4.9 x10E3/uL (ref 1.4–7.0)
Neutrophils: 64 %
Platelets: 275 x10E3/uL (ref 150–450)
RBC: 4.85 x10E6/uL (ref 3.77–5.28)
RDW: 13.1 % (ref 11.7–15.4)
WBC: 7.7 x10E3/uL (ref 3.4–10.8)

## 2024-07-20 LAB — LIPID PANEL
Chol/HDL Ratio: 4.1 ratio (ref 0.0–4.4)
Cholesterol, Total: 215 mg/dL — ABNORMAL HIGH (ref 100–199)
HDL: 52 mg/dL (ref >39)
LDL Chol Calc (NIH): 140 mg/dL — ABNORMAL HIGH (ref 0–99)
Triglycerides: 130 mg/dL (ref 0–149)
VLDL Cholesterol Cal: 23 mg/dL (ref 5–40)

## 2024-07-20 LAB — HEMOGLOBIN A1C
Est. average glucose Bld gHb Est-mCnc: 117 mg/dL
Hgb A1c MFr Bld: 5.7 % — ABNORMAL HIGH (ref 4.8–5.6)

## 2025-07-25 ENCOUNTER — Encounter: Admitting: Physician Assistant
